# Patient Record
Sex: Male | Born: 1950 | Race: White | Hispanic: No | Marital: Married | Smoking: Former smoker
Health system: Southern US, Community
[De-identification: ages and names within clinical notes are randomized; demographics above are authoritative.]

## PROBLEM LIST (undated history)

## (undated) DIAGNOSIS — I255 Ischemic cardiomyopathy: Secondary | ICD-10-CM

## (undated) DIAGNOSIS — I2510 Atherosclerotic heart disease of native coronary artery without angina pectoris: Secondary | ICD-10-CM

## (undated) DIAGNOSIS — Z95810 Presence of automatic (implantable) cardiac defibrillator: Secondary | ICD-10-CM

## (undated) DIAGNOSIS — N529 Male erectile dysfunction, unspecified: Secondary | ICD-10-CM

## (undated) DIAGNOSIS — Z87891 Personal history of nicotine dependence: Secondary | ICD-10-CM

## (undated) DIAGNOSIS — I739 Peripheral vascular disease, unspecified: Secondary | ICD-10-CM

## (undated) DIAGNOSIS — E118 Type 2 diabetes mellitus with unspecified complications: Secondary | ICD-10-CM

## (undated) DIAGNOSIS — Z789 Other specified health status: Secondary | ICD-10-CM

## (undated) DIAGNOSIS — I10 Essential (primary) hypertension: Secondary | ICD-10-CM

## (undated) DIAGNOSIS — E785 Hyperlipidemia, unspecified: Secondary | ICD-10-CM

## (undated) HISTORY — DX: Ischemic cardiomyopathy: I25.5

## (undated) HISTORY — DX: Personal history of nicotine dependence: Z87.891

## (undated) HISTORY — DX: Hyperlipidemia, unspecified: E78.5

## (undated) HISTORY — DX: Male erectile dysfunction, unspecified: N52.9

## (undated) HISTORY — DX: Essential (primary) hypertension: I10

## (undated) HISTORY — DX: Peripheral vascular disease, unspecified: I73.9

## (undated) HISTORY — DX: Presence of automatic (implantable) cardiac defibrillator: Z95.810

## (undated) HISTORY — PX: INCISION AND DRAINAGE PERIRECTAL ABSCESS: SHX1804

## (undated) HISTORY — DX: Type 2 diabetes mellitus with unspecified complications: E11.8

## (undated) HISTORY — DX: Other specified health status: Z78.9

## (undated) HISTORY — DX: Atherosclerotic heart disease of native coronary artery without angina pectoris: I25.10

## (undated) HISTORY — PX: ILIAC ARTERY STENT: SHX1786

---

## 1998-02-17 DIAGNOSIS — I2510 Atherosclerotic heart disease of native coronary artery without angina pectoris: Secondary | ICD-10-CM

## 1998-02-17 HISTORY — DX: Atherosclerotic heart disease of native coronary artery without angina pectoris: I25.10

## 1998-02-17 HISTORY — PX: PERCUTANEOUS CORONARY STENT INTERVENTION (PCI-S): SHX6016

## 2006-02-17 HISTORY — PX: CORONARY ARTERY BYPASS GRAFT: SHX141

## 2006-09-10 ENCOUNTER — Inpatient Hospital Stay: Payer: Self-pay | Admitting: Internal Medicine

## 2006-09-11 ENCOUNTER — Inpatient Hospital Stay (HOSPITAL_COMMUNITY): Admission: AD | Admit: 2006-09-11 | Discharge: 2006-09-19 | Payer: Self-pay | Admitting: Cardiovascular Disease

## 2006-09-12 ENCOUNTER — Encounter: Payer: Self-pay | Admitting: Cardiothoracic Surgery

## 2006-09-12 ENCOUNTER — Ambulatory Visit: Payer: Self-pay | Admitting: Cardiothoracic Surgery

## 2006-09-28 ENCOUNTER — Ambulatory Visit: Payer: Self-pay | Admitting: Cardiothoracic Surgery

## 2006-10-09 ENCOUNTER — Ambulatory Visit: Payer: Self-pay | Admitting: Cardiothoracic Surgery

## 2006-10-09 ENCOUNTER — Encounter: Admission: RE | Admit: 2006-10-09 | Discharge: 2006-10-09 | Payer: Self-pay | Admitting: Cardiothoracic Surgery

## 2006-10-21 ENCOUNTER — Encounter: Payer: Self-pay | Admitting: Cardiovascular Disease

## 2006-11-18 ENCOUNTER — Encounter: Payer: Self-pay | Admitting: Cardiovascular Disease

## 2006-12-11 ENCOUNTER — Ambulatory Visit: Payer: Self-pay | Admitting: Cardiothoracic Surgery

## 2007-01-01 ENCOUNTER — Encounter: Payer: Self-pay | Admitting: Cardiovascular Disease

## 2007-01-08 ENCOUNTER — Ambulatory Visit (HOSPITAL_COMMUNITY): Admission: RE | Admit: 2007-01-08 | Discharge: 2007-01-08 | Payer: Self-pay | Admitting: Cardiology

## 2007-01-19 ENCOUNTER — Inpatient Hospital Stay (HOSPITAL_COMMUNITY): Admission: RE | Admit: 2007-01-19 | Discharge: 2007-01-20 | Payer: Self-pay | Admitting: Cardiovascular Disease

## 2007-01-19 ENCOUNTER — Encounter: Payer: Self-pay | Admitting: Cardiovascular Disease

## 2007-02-18 HISTORY — PX: CARDIAC DEFIBRILLATOR PLACEMENT: SHX171

## 2007-03-04 ENCOUNTER — Encounter: Payer: Self-pay | Admitting: Cardiovascular Disease

## 2007-04-30 ENCOUNTER — Ambulatory Visit: Payer: Self-pay | Admitting: Cardiothoracic Surgery

## 2008-06-21 IMAGING — CR DG CHEST 1V PORT
1 series · 1 of 1 positions shown · non-contrast
Comparison: none

REASON FOR EXAM: cp
COMMENTS:

PROCEDURE:     DXR - DXR PORTABLE CHEST SINGLE VIEW  - September 10, 2006  [DATE]
RESULT:     PA view of the chest shows the lung fields to be clear. No
pleural effusion or pneumothorax is seen. The heart is upper limits to
normal in size or mildly enlarged. No acute bony abnormalities are seen.

[view not recorded]
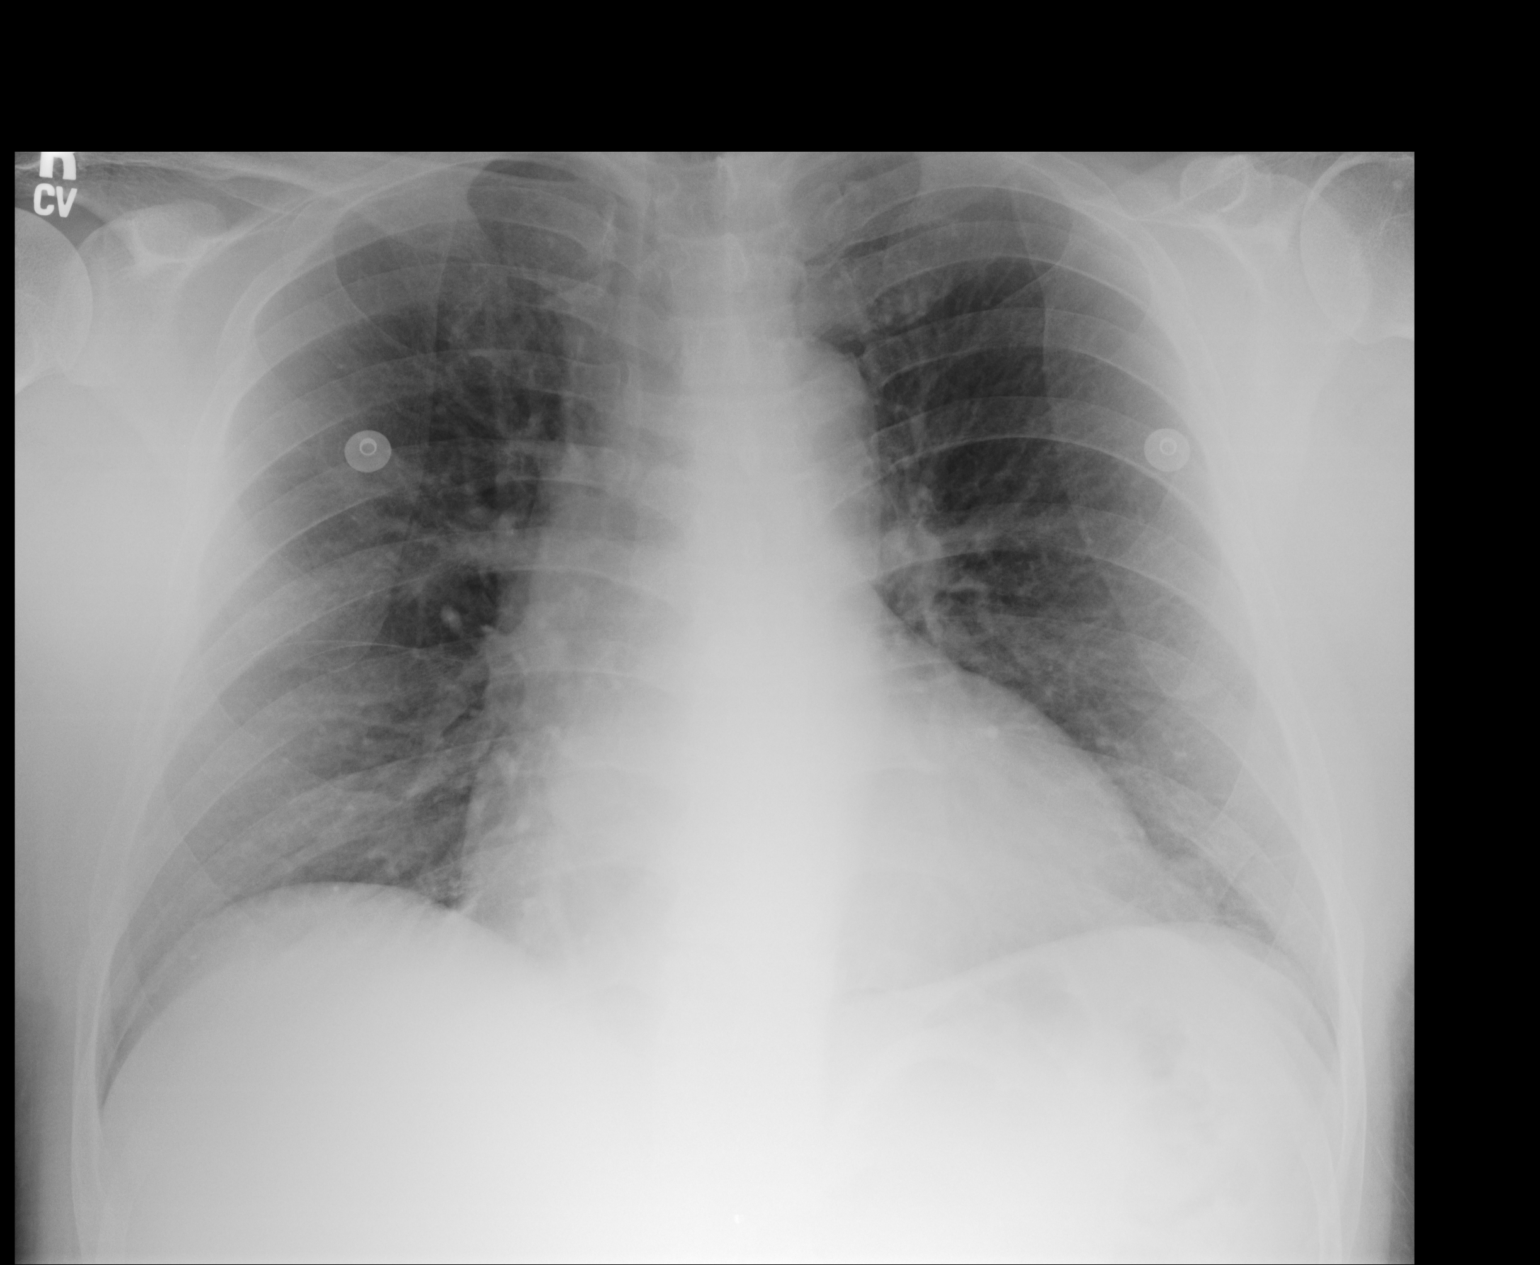

[1 of 1 positions shown; findings below may reference images not displayed]

IMPRESSION: 1. The lung fields are clear.
2. The chest is upper limits to normal in size or mildly enlarged.

## 2008-06-26 IMAGING — CR DG CHEST 1V PORT
1 series · 1 of 1 positions shown · non-contrast
Comparison: 09/14/06.

CLINICAL DATA: Postop CABG.  
 PORTABLE CHEST ? 1 VIEW ? 09/15/06 ? 7574 HOURS:

[view not recorded]
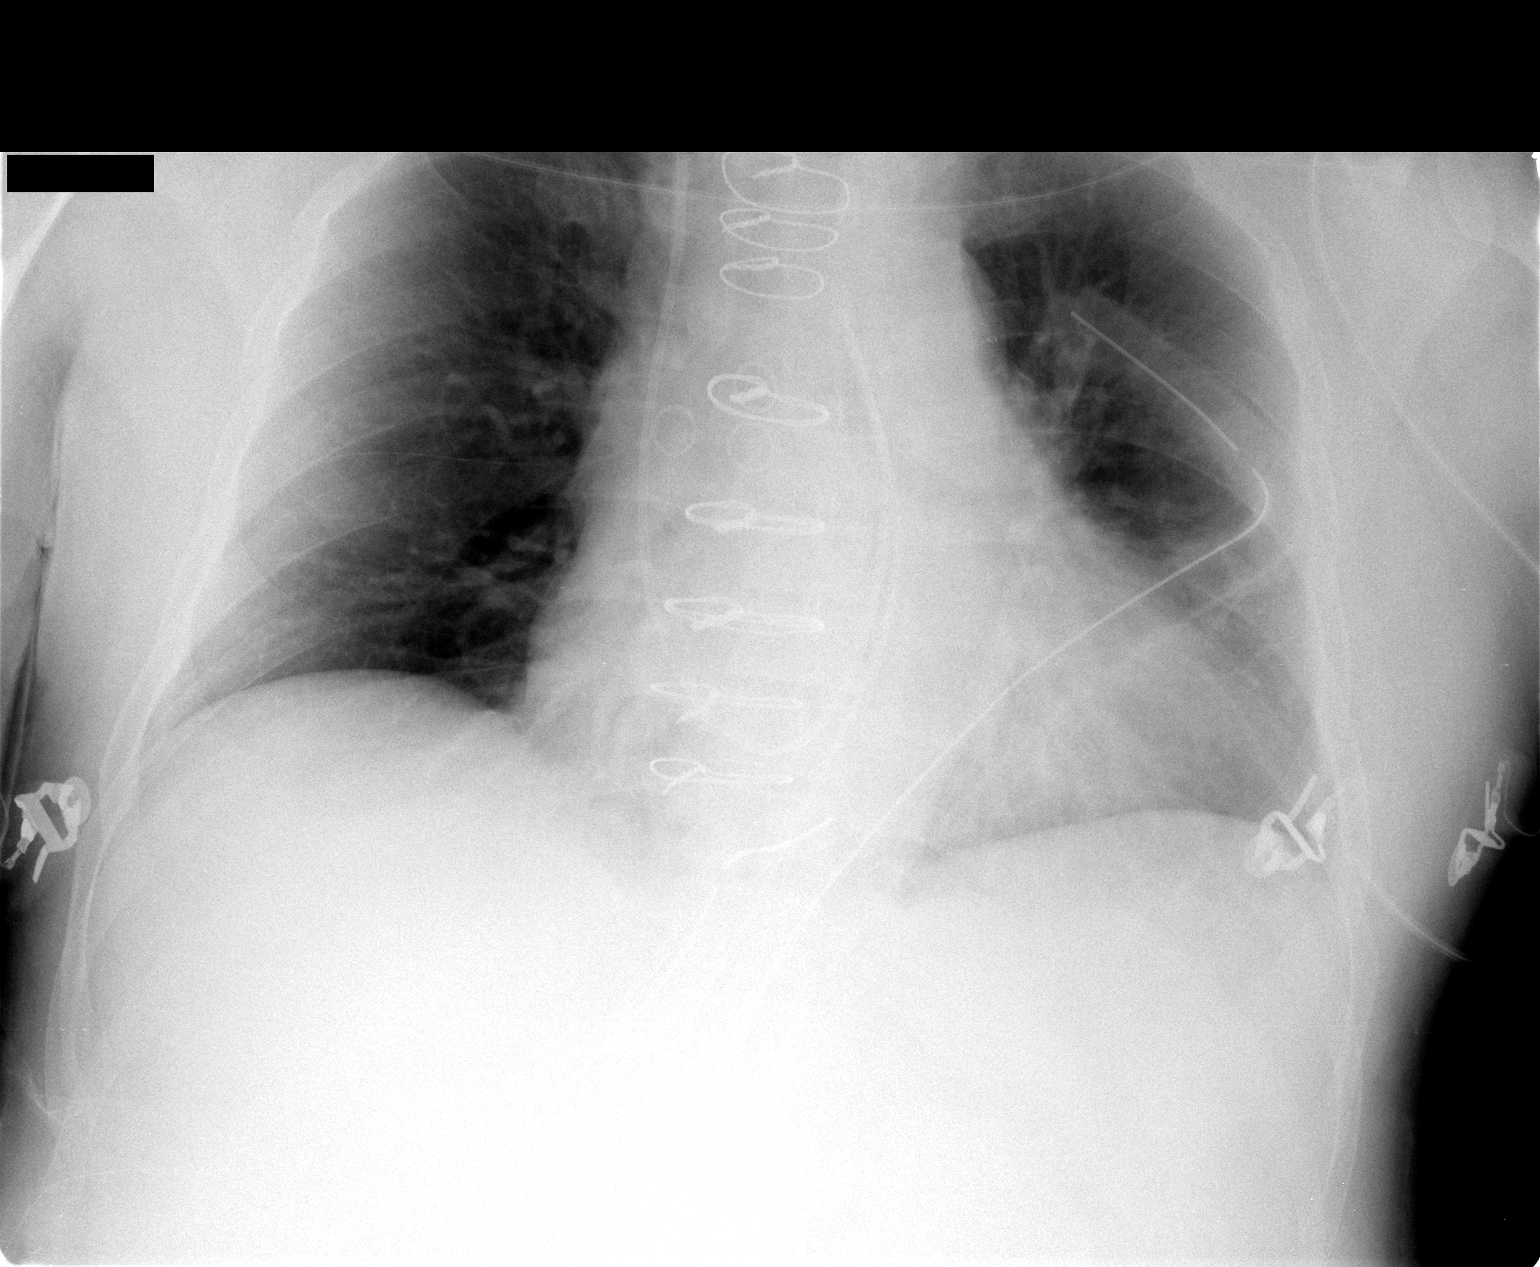

[1 of 1 positions shown; findings below may reference images not displayed]

FINDINGS: There has been interval extubation with removal of the nasogastric tube.  Swan-Ganz catheter and chest tubes remain in place.  There is no pneumothorax or edema.  Mild atelectasis is present at the left lung base.
IMPRESSION: No pneumothorax following extubation.  Mild left basilar atelectasis.

## 2008-07-20 IMAGING — CR DG CHEST 2V
2 series · 2 of 2 positions shown · non-contrast
Comparison: 09/17/06.

CLINICAL DATA: CABG.
 CHEST - TWO VIEWS:

[view not recorded (1 of 2)]
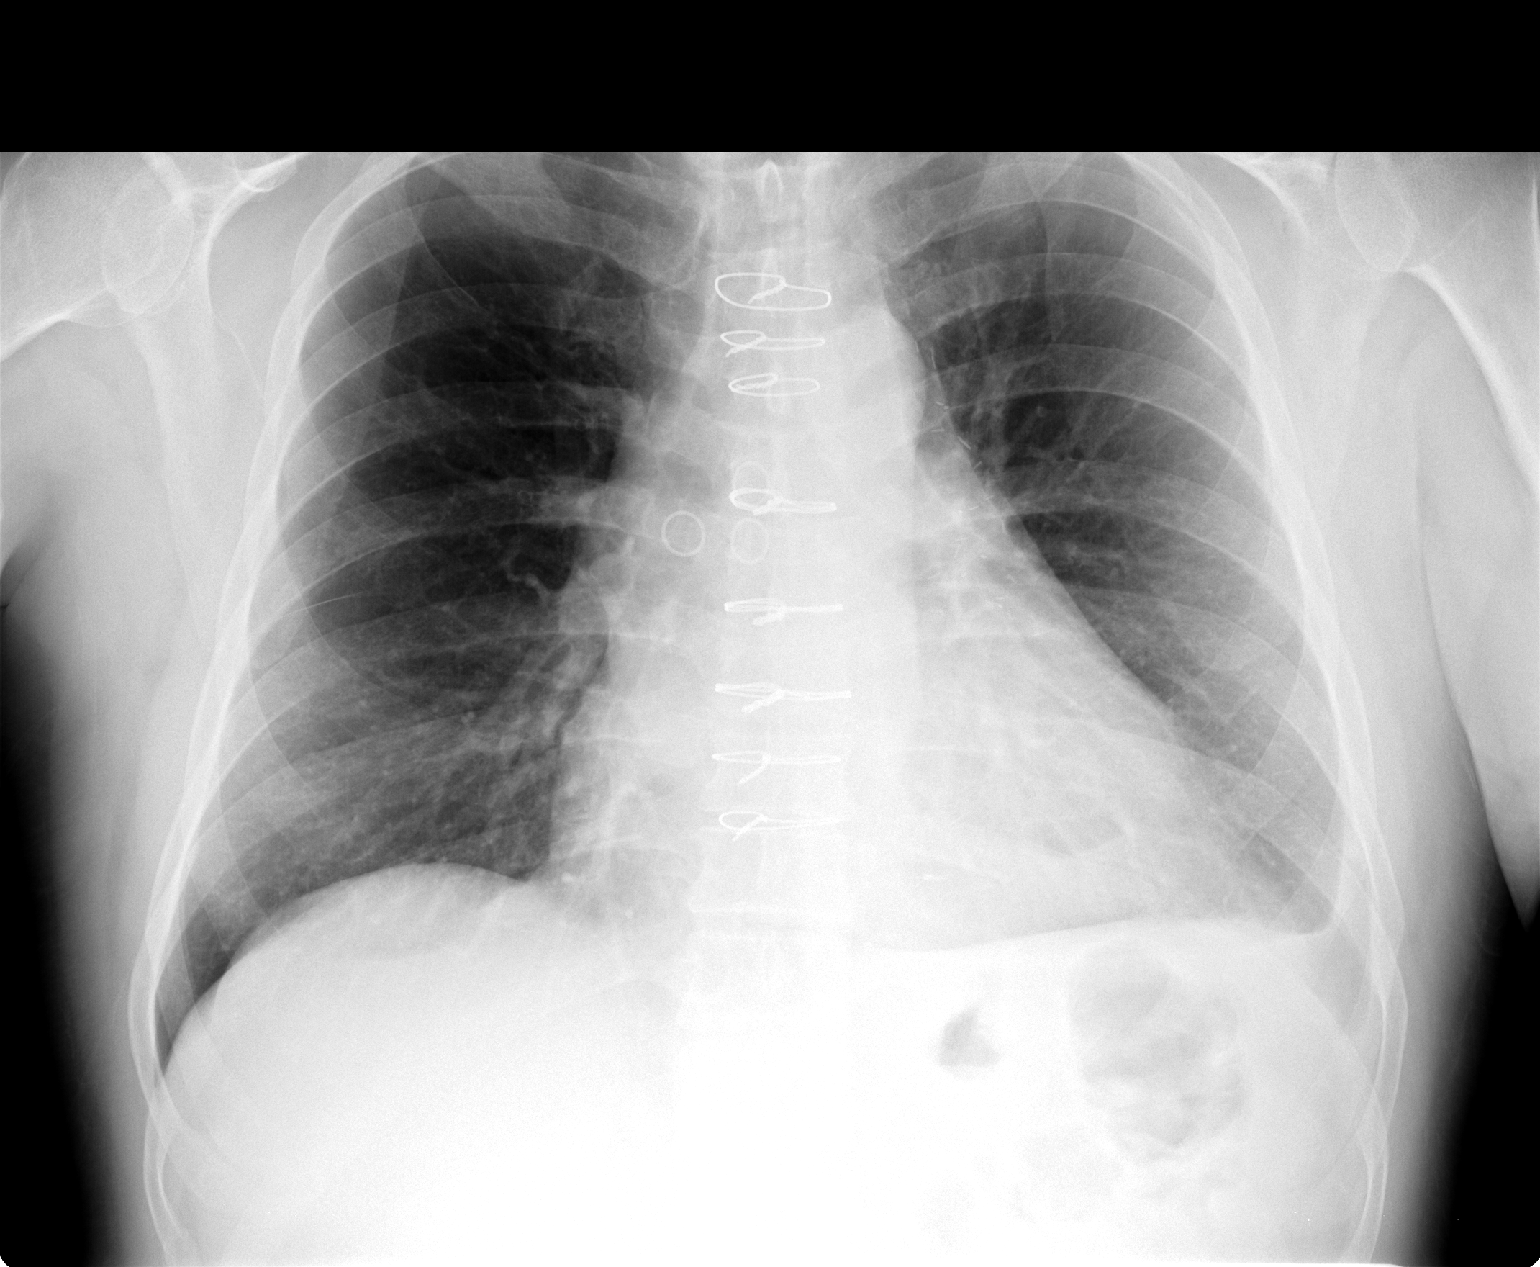

[view not recorded (2 of 2)]
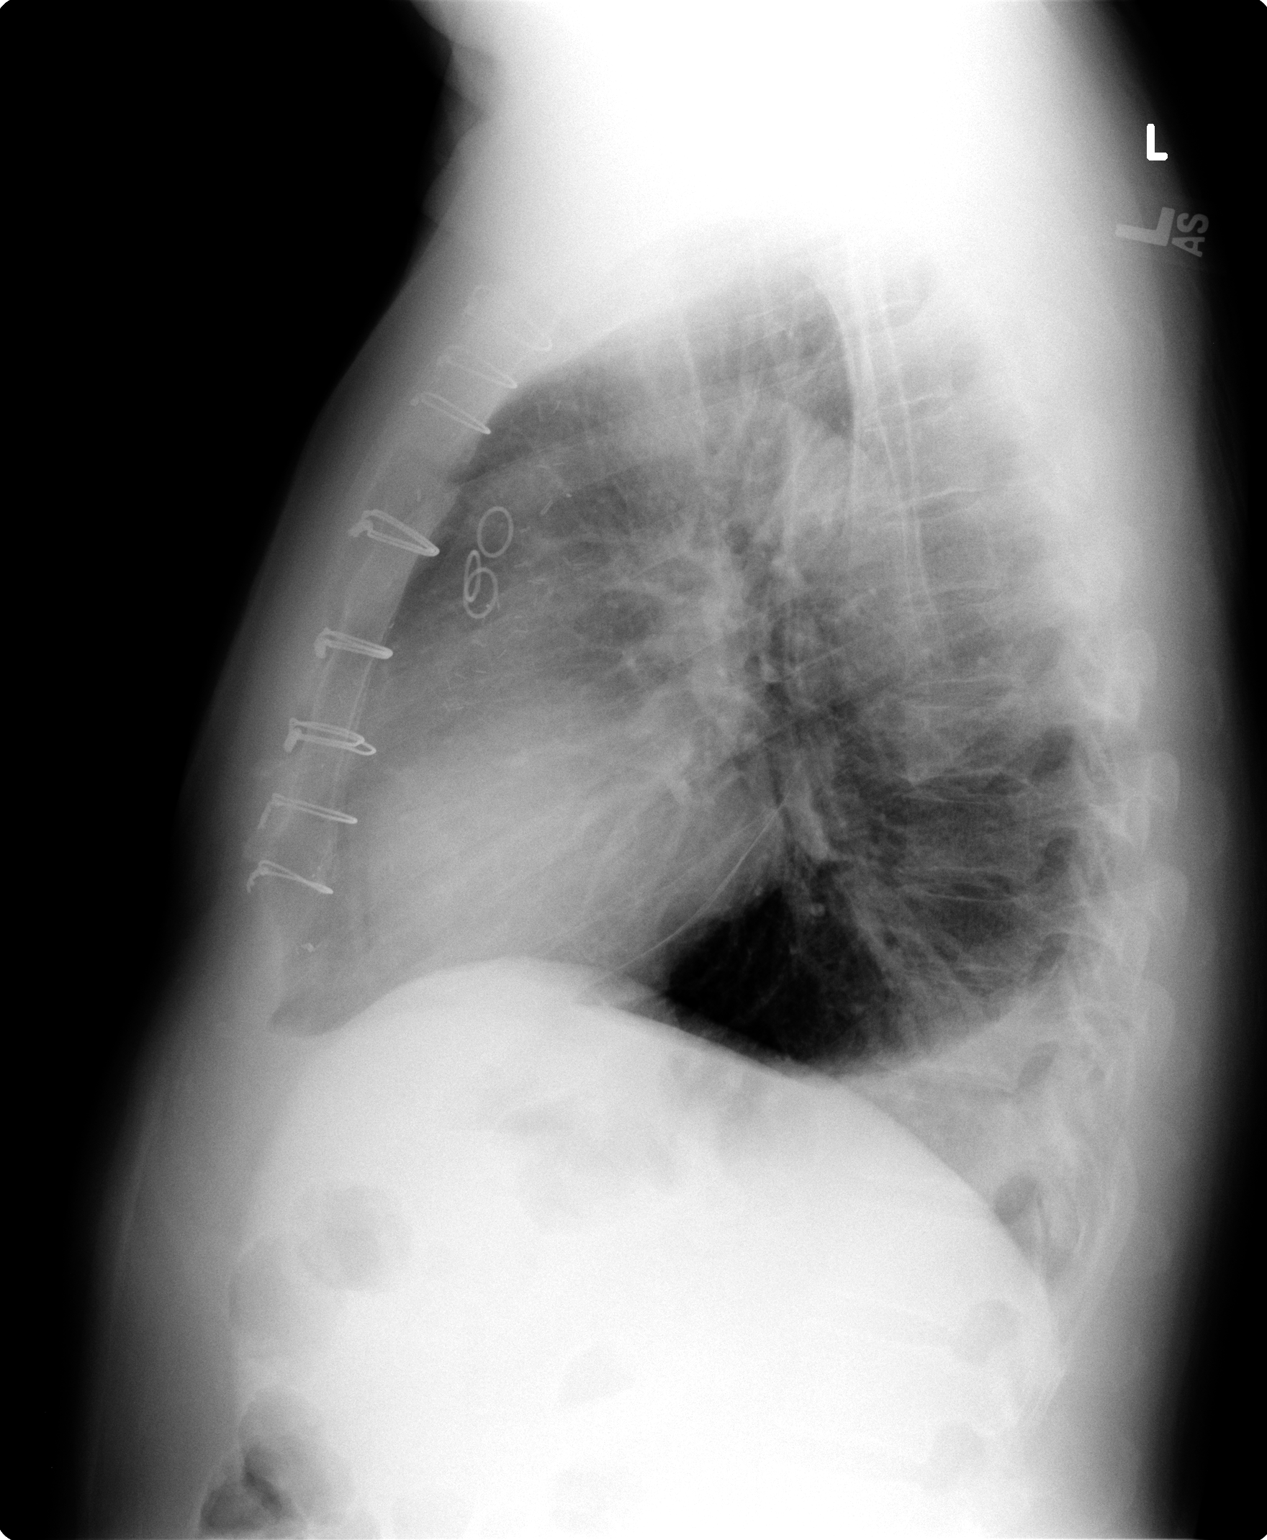

[2 of 2 positions shown; findings below may reference images not displayed]

Small left pleural effusion.  Lungs well expanded and clear of an active process.  Mild cardiomegaly.
IMPRESSION: Mild cardiomegaly.  Small left pleural effusion.

## 2009-01-15 ENCOUNTER — Encounter: Payer: Self-pay | Admitting: Cardiovascular Disease

## 2009-01-24 ENCOUNTER — Encounter: Payer: Self-pay | Admitting: Cardiovascular Disease

## 2009-05-16 DIAGNOSIS — Z95810 Presence of automatic (implantable) cardiac defibrillator: Secondary | ICD-10-CM

## 2009-05-16 DIAGNOSIS — I25810 Atherosclerosis of coronary artery bypass graft(s) without angina pectoris: Secondary | ICD-10-CM

## 2009-05-16 DIAGNOSIS — I739 Peripheral vascular disease, unspecified: Secondary | ICD-10-CM

## 2009-05-16 DIAGNOSIS — I10 Essential (primary) hypertension: Secondary | ICD-10-CM

## 2009-05-16 DIAGNOSIS — E785 Hyperlipidemia, unspecified: Secondary | ICD-10-CM

## 2009-05-16 DIAGNOSIS — I255 Ischemic cardiomyopathy: Secondary | ICD-10-CM

## 2009-05-16 HISTORY — DX: Presence of automatic (implantable) cardiac defibrillator: Z95.810

## 2009-06-14 ENCOUNTER — Encounter: Payer: Self-pay | Admitting: Cardiovascular Disease

## 2009-06-26 ENCOUNTER — Telehealth: Payer: Self-pay | Admitting: Cardiovascular Disease

## 2009-07-30 ENCOUNTER — Telehealth: Payer: Self-pay | Admitting: Cardiovascular Disease

## 2009-08-03 ENCOUNTER — Ambulatory Visit: Payer: Self-pay | Admitting: Cardiovascular Disease

## 2009-08-07 ENCOUNTER — Ambulatory Visit: Payer: Self-pay | Admitting: Cardiovascular Disease

## 2009-08-08 LAB — CONVERTED CEMR LAB
Albumin: 4.6 g/dL (ref 3.5–5.2)
Alkaline Phosphatase: 74 units/L (ref 39–117)
Bilirubin, Direct: 0.1 mg/dL (ref 0.0–0.3)
HDL: 43 mg/dL (ref >39)
Indirect Bilirubin: 0.5 mg/dL (ref 0.0–0.9)
LDL Cholesterol: 63 mg/dL (ref 0–99)
Total Bilirubin: 0.6 mg/dL (ref 0.3–1.2)

## 2009-10-01 ENCOUNTER — Ambulatory Visit: Payer: Self-pay | Admitting: Internal Medicine

## 2009-10-01 DIAGNOSIS — I4949 Other premature depolarization: Secondary | ICD-10-CM

## 2009-10-01 DIAGNOSIS — G4730 Sleep apnea, unspecified: Secondary | ICD-10-CM

## 2009-10-01 DIAGNOSIS — I5022 Chronic systolic (congestive) heart failure: Secondary | ICD-10-CM

## 2009-10-04 ENCOUNTER — Encounter: Payer: Self-pay | Admitting: Internal Medicine

## 2009-10-04 ENCOUNTER — Ambulatory Visit: Payer: Self-pay

## 2009-12-24 ENCOUNTER — Telehealth: Payer: Self-pay | Admitting: Cardiovascular Disease

## 2009-12-27 ENCOUNTER — Ambulatory Visit: Payer: Self-pay

## 2009-12-27 ENCOUNTER — Encounter: Payer: Self-pay | Admitting: Cardiovascular Disease

## 2010-01-03 ENCOUNTER — Ambulatory Visit: Payer: Self-pay | Admitting: Internal Medicine

## 2010-01-18 ENCOUNTER — Encounter (INDEPENDENT_AMBULATORY_CARE_PROVIDER_SITE_OTHER): Payer: Self-pay | Admitting: *Deleted

## 2010-01-25 ENCOUNTER — Encounter: Payer: Self-pay | Admitting: Cardiovascular Disease

## 2010-01-25 ENCOUNTER — Ambulatory Visit: Payer: Self-pay | Admitting: Cardiovascular Disease

## 2010-01-26 LAB — CONVERTED CEMR LAB
ALT: 18 units/L (ref 0–53)
AST: 21 units/L (ref 0–37)
Albumin: 4.7 g/dL (ref 3.5–5.2)
HDL: 47 mg/dL (ref >39)
Total Bilirubin: 0.6 mg/dL (ref 0.3–1.2)
Total CHOL/HDL Ratio: 3
VLDL: 19 mg/dL (ref 0–40)

## 2010-01-28 ENCOUNTER — Telehealth (INDEPENDENT_AMBULATORY_CARE_PROVIDER_SITE_OTHER): Payer: Self-pay | Admitting: *Deleted

## 2010-03-13 ENCOUNTER — Telehealth: Payer: Self-pay | Admitting: Cardiovascular Disease

## 2010-03-20 NOTE — Progress Notes (Signed)
Summary: ULTRASOUND  Phone Note Call from Patient Call back at Home Phone [phone]   Caller: SELF Call For: [facility] Summary of Call: PT WOULD LIKE A CALL BACK ABOUT NEEDING AN ULTRASOUND ON LEGS-RECEIVED A NOTICE FOR THIS FROM [facility] Initial call taken by: Harlon Flor,  December 24, 2009 8:09 AM  Follow-up for Phone Call        Pt has history of PVD and LE stents. Will schedule pt for LEA Follow-up by: Benedict Needy, RN,  December 24, 2009 8:48 AM

## 2010-03-20 NOTE — Assessment & Plan Note (Signed)
Summary: 6 month follow-up/ewj   Visit Type:  Follow-up Referring Navil Kole:  Tim Gollan,M.D. Primary Yosiah Jasmin:  None  CC:  6 month f/u.Marland Kitchen  History of Present Illness: Mr Paul Moore is a 60 year old gentleman with a history of coronary artery disease, bypass x5 in July 2008, catheterization in December 2008 for recurrent chest pain with stents to his native RCA, occluded vein graft to the PDA, 60-70% proximal vein graft disease to the diagonal,  s/p ICD implantation for primary prevention by [facility], presents for routine followup.  Since his last clinic visit, he has had significant weight loss of 10-15 pounds. He exercises daily, uses a game console with bowling and tennis. Mild shortness of breath though this is stable. Very rare episodes of chest discomfort though these are fleeting, stabbing and sometimes at rest. he would like to hold the Niaspan as this is very expensive. In the past, he has had diarrhea with metoprolol and Coreg.   echocardiogram in 2009 shows ejection fraction 20-30%, mildly dilated left ventricle, moderately enlarged right ventricle with moderately decreased systolic function.  Last stress test in November 2008, treadmill where he exercised only for 3 minutes, 5 minutes. The large region of decreased perfusion in the inferior and lateral walls no ischemia, ejection fraction 22%.  Last catheterization December 2008 with a PTCA and stenting of the RCA, Promus stent 3.0 x 28 mm. occluded vein graft to the RCA at the ostium, severe diffuse native RCA disease with 90% proximal, 90% mid, high-grade PDA narrowing in a small vessel, moderate stenosis at the ostium of the vein graft to the diagonal, patent LIMA and patent vein graft to the circumflex    Current Medications (verified): 1)  Aspirin Ec 325 Mg Tbec (Aspirin) .... Take One Tablet By Mouth Daily 2)  Plavix 75 Mg Tabs (Clopidogrel Bisulfate) .... Take One Tablet By Mouth Daily 3)  Isosorbide Mononitrate  Cr 30 Mg Xr24h-Tab (Isosorbide Mononitrate) .... Take One Tablet By Mouth Daily 4)  Klor-Con M20 20 Meq Cr-Tabs (Potassium Chloride Crys Cr) .Marland Kitchen.. 1 Tab Daily 5)  Furosemide 40 Mg Tabs (Furosemide) .... Take One Tablet By Mouth Daily. 6)  Crestor 10 Mg Tabs (Rosuvastatin Calcium) .... Take One Tablet By Mouth Daily. 7)  Niaspan 1000 Mg Cr-Tabs (Niacin (Antihyperlipidemic)) .Marland Kitchen.. 1 Tab Daily 8)  Lisinopril-Hydrochlorothiazide 20-12.5 Mg Tabs (Lisinopril-Hydrochlorothiazide) .Marland Kitchen.. 1 Once Daily  Allergies (verified): 1)  ! * Amiodarone  Past History:  Past Medical History: Last updated: 10/01/2009 Hypertension.  History of smoking.  Peripheral vascular disease, status post right iliac stent Ischemic cardiomyopathy with an ejection fraction of 20% -- ICD 04/2007  dyslipidemia.  CAD--CABG and stents.  Past Surgical History: Last updated: 05/16/2009 I&D of perirectal abscess Coronary artery bypass grafting x5 -- 09/14/06 right iliac stent ICD-- 04/2007  Family History: Last updated: 10/01/2009 Family History of Coronary Artery Disease:   Social History: Last updated: 10/01/2009 Disabled  Married  Tobacco Use - Former. Quit 2008. Alcohol Use - no Regular Exercise - yes--stat.bike 4 miles daily. Drug Use - no  Risk Factors: Exercise: yes (10/01/2009)  Risk Factors: Smoking Status: quit (10/01/2009)  Review of Systems       The patient complains of weight loss, chest pain, and dyspnea on exertion.  The patient denies fever, weight gain, vision loss, decreased hearing, hoarseness, syncope, peripheral edema, prolonged cough, abdominal pain, incontinence, muscle weakness, depression, and enlarged lymph nodes.    Vital Signs:  Patient profile:   60 year old male Height:  67 inches Weight:      210.75 pounds BMI:     33.13 BP sitting:   142 / 68  (left arm) Cuff size:   large  Vitals Entered By: Lysbeth Galas CMA (January 25, 2010 11:12 AM)  Physical Exam  General:   Well developed, well nourished, in no acute distress. Head:  normal HEENT Neck:  supple without thyromegaly; no venous distention Lungs:  clear to auscultation Heart:  Non-displaced PMI, chest non-tender; regular rate and rhythm, S1, S2 without murmurs, rubs or gallops. Carotid upstroke normal, no bruit. Pedals normal pulses. No edema, no varicosities. Abdomen:  Bowel sounds positive; abdomen soft and non-tender without masses, mild obesity Msk:  Back normal, normal gait. Muscle strength and tone normal. Pulses:  pulses normal in all 4 extremities Extremities:  No clubbing or cyanosis. Neurologic:  Alert and oriented x 3. Skin:  Intact without lesions or rashes. Psych:  Normal affect.    ICD Specifications Following MD:  Sherryl Manges, MD     ICD Vendor:  [employer]     ICD Model Number:  [id number]     ICD Serial Number:  [id number] ICD DOI:  05/13/2007     ICD Implanting MD:  NOT IMPLANTED BY [place]  Lead 1:    Location: RA     DOI: 05/13/2007     Model #: [id number]     Serial #: [id number]     Status: active Lead 2:    Location: RV     DOI: 05/13/2007     Model #: 6213     Serial #: [id number]     Status: active  Indications::  CM   ICD Follow Up ICD Dependent:  No      Episodes Coumadin:  No  Brady Parameters Mode DDDR+     Lower Rate Limit:  60     Upper Rate Limit 130 PAV 180     Sensed AV Delay:  150  Tachy Zones VF:  171     VT:  207     VT1:  182     Impression & Recommendations:  Problem # 1:  SYSTOLIC HEART FAILURE, CHRONIC (ICD-428.22) ejection fraction 35%. No signs of systolic heart failure. We will continue his current medication regimen. We have added bystolic 2.5 mg daily with titration to 5 mg daily after 1 to 2 weeks.  His updated medication list for this problem includes:    Aspirin Ec 325 Mg Tbec (Aspirin) .Marland Kitchen... Take one tablet by mouth daily    Plavix 75 Mg Tabs (Clopidogrel bisulfate) .Marland Kitchen... Take one tablet by mouth daily    Isosorbide Mononitrate Cr 30 Mg Xr24h-tab  (Isosorbide mononitrate) .Marland Kitchen... Take one tablet by mouth daily    Furosemide 40 Mg Tabs (Furosemide) .Marland Kitchen... Take one tablet by mouth daily.    Lisinopril-hydrochlorothiazide 20-12.5 Mg Tabs (Lisinopril-hydrochlorothiazide) .Marland Kitchen... 1 once daily    Bystolic 5 Mg Tabs (Nebivolol hcl) .Marland Kitchen... Take 1/2 tablet once daily  Problem # 2:  HYPERTENSION (ICD-401.9) Blood pressure is well controlled on his current medication regimen. Bystolic is new.   His updated medication list for this problem includes:    Aspirin Ec 325 Mg Tbec (Aspirin) .Marland Kitchen... Take one tablet by mouth daily    Furosemide 40 Mg Tabs (Furosemide) .Marland Kitchen... Take one tablet by mouth daily.    Lisinopril-hydrochlorothiazide 20-12.5 Mg Tabs (Lisinopril-hydrochlorothiazide) .Marland Kitchen... 1 once daily    Bystolic 5 Mg Tabs (Nebivolol hcl) .Marland Kitchen... Take 1/2 tablet once daily  Orders: T-Lipid  Profile [phone]) T-Hepatic Function [phone])  Problem # 3:  DYSLIPIDEMIA (ICD-272.4) LDL is slightly above goal. We will have him alternate Crestor 20 mg with 10 mg.  The following medications were removed from the medication list:    Niaspan 1000 Mg Cr-tabs (Niacin (antihyperlipidemic)) .Marland Kitchen... 1 tab daily His updated medication list for this problem includes:    Crestor 20 Mg Tabs (Rosuvastatin calcium) .Marland Kitchen... Take one tablet by mouth daily.  Orders: T-Lipid Profile [phone]) T-Hepatic Function [phone])  Problem # 4:  CORONARY ATHEROSCLEROSIS, AUTOLOGOUS VEIN BYPASS GRAFT (ICD-414.02) Severe coronary artery disease with occluded grafts x 2. Continue aggressive medical management. No testing at this time.  His updated medication list for this problem includes:    Aspirin Ec 325 Mg Tbec (Aspirin) .Marland Kitchen... Take one tablet by mouth daily    Plavix 75 Mg Tabs (Clopidogrel bisulfate) .Marland Kitchen... Take one tablet by mouth daily    Isosorbide Mononitrate Cr 30 Mg Xr24h-tab (Isosorbide mononitrate) .Marland Kitchen... Take one tablet by mouth daily     Lisinopril-hydrochlorothiazide 20-12.5 Mg Tabs (Lisinopril-hydrochlorothiazide) .Marland Kitchen... 1 once daily    Bystolic 5 Mg Tabs (Nebivolol hcl) .Marland Kitchen... Take 1/2 tablet once daily  Orders: T-Lipid Profile [phone]) T-Hepatic Function [phone])  Patient Instructions: 1)  Your physician recommends that you schedule a follow-up appointment in: 6 months 2)  Your physician has recommended you make the following change in your medication: Stop Niaspan. Start Bystolic 5mg  1/2 tab daily. Call our office when you need prescription. Increase Crestor 20mg  once daily.  Prescriptions: BYSTOLIC 5 MG TABS (NEBIVOLOL HCL) Take 1/2 tablet once daily  #28 x 0   Entered by:   Lanny Hurst RN   Authorized by:   Dossie Arbour MD   Signed by:   Lanny Hurst RN on 01/25/2010   Method used:   Samples Given   RxID:   [id number] FUROSEMIDE 40 MG TABS (FUROSEMIDE) Take one tablet by mouth daily.  #30 x 6   Entered by:   Lanny Hurst RN   Authorized by:   Dossie Arbour MD   Signed by:   Lanny Hurst RN on 01/25/2010   Method used:   Electronically to        [facility] [address] (retail)       [address]       [place], [place]  [address]       Ph: [phone]       Fax: [phone]   RxID:   [id number] LISINOPRIL-HYDROCHLOROTHIAZIDE 20-12.5 MG TABS (LISINOPRIL-HYDROCHLOROTHIAZIDE) 1 once daily  #30 x 6   Entered by:   Lanny Hurst RN   Authorized by:   Dossie Arbour MD   Signed by:   Lanny Hurst RN on 01/25/2010   Method used:   Electronically to        [facility] [address] (retail)       [address]       [place], [place]  [address]       Ph: [phone]       Fax: [phone]   RxID:   [id number] CRESTOR 20 MG TABS (ROSUVASTATIN CALCIUM) Take one tablet by mouth daily.  #30 x 6   Entered by:   Lanny Hurst RN   Authorized by:   Dossie Arbour MD   Signed by:   Lanny Hurst RN on 01/25/2010   Method used:   Electronically to  [facility] [address] (retail)       [address]       [place], [place]  [address]       Ph: [phone]       Fax: [phone]   RxID:   [id number] ISOSORBIDE MONONITRATE CR 30 MG XR24H-TAB (ISOSORBIDE MONONITRATE) Take one tablet by mouth daily  #30 x 6   Entered by:   Lanny Hurst RN   Authorized by:   Dossie Arbour MD   Signed by:   Lanny Hurst RN on 01/25/2010   Method used:   Electronically to        [facility] [address] (retail)       [address]       [place], [place]  [address]       Ph: [phone]       Fax: [phone]   RxID:   [id number] PLAVIX 75 MG TABS (CLOPIDOGREL BISULFATE) Take one tablet by mouth daily  #30 x 6   Entered by:   Lanny Hurst RN   Authorized by:   Dossie Arbour MD   Signed by:   Lanny Hurst RN on 01/25/2010   Method used:   Electronically to        [facility] [address] (retail)       [address]       [place], [place]  [address]       Ph: [phone]       Fax: [phone]   RxID:   [id number]

## 2010-03-20 NOTE — Cardiovascular Report (Signed)
Summary: Office Visit Remote   Office Visit Remote   Imported By: Roderic Ovens 01/18/2010 15:06:05  _____________________________________________________________________  External Attachment:    Type:   Image     Comment:   External Document

## 2010-03-20 NOTE — Assessment & Plan Note (Signed)
Summary: NP6/SGC   Visit Type:  Initial Consult Primary Provider:  None  CC:  Occasional stabbing pain in back .  History of Present Illness: 60 year old gentleman known to me from [facility] with a history of coronary artery disease, bypass x5 in July 2008, catheterization in December 2008 for recurrent chest pain with stents to his native RCA, occluded vein graft to the PDA, 60-70% proximal vein graft disease to the diagonal with ICD placed in March 1 9, ejection fraction at that time of 30% who presents to establish care.  Mr. Debow states that he has started to use his exercise bike. His weight was increasing and not to greater than 225 pounds. He has lost several pounds over the past several months. He does have some continued shortness of breath with exertion, fatigue, occasional stabbing chest pain. He reports one episode of pain when he had to take a nitroglycerin. He reports the losartan changed his personality and made him angry so he took himself off this medication.  echocardiogram in 2009 shows ejection fraction 20-30%, mildly dilated left ventricle, moderately enlarged right ventricle with moderately decreased systolic function.  Last stress test in November 2008, treadmill where he exercised only for 3 minutes, 5 minutes. The large region of decreased perfusion in the inferior and lateral walls no ischemia, ejection fraction 22%.  Last catheterization December 2008 with a PTCA and stenting of the RCA, Promus stent 3.0 x 28 mm. occluded vein graft to the RCA at the ostium, severe diffuse native RCA disease with 90% proximal, 90% mid, high-grade PDA narrowing in a small vessel, moderate stenosis at the ostium of the vein graft to the diagonal, patent LIMA and patent vein graft to the circumflex   Current Medications (verified): 1)  Aspirin Ec 325 Mg Tbec (Aspirin) .... Take One Tablet By Mouth Daily 2)  Plavix 75 Mg Tabs (Clopidogrel Bisulfate) .... Take  One Tablet By Mouth Daily 3)  Cozaar 100 Mg Tabs (Losartan Potassium) .Marland Kitchen.. 1 Tab Daily 4)  Isosorbide Mononitrate Cr 30 Mg Xr24h-Tab (Isosorbide Mononitrate) .... Take One Tablet By Mouth Daily 5)  Klor-Con M20 20 Meq Cr-Tabs (Potassium Chloride Crys Cr) .Marland Kitchen.. 1 Tab Daily 6)  Furosemide 40 Mg Tabs (Furosemide) .... Take One Tablet By Mouth Daily. 7)  Crestor 10 Mg Tabs (Rosuvastatin Calcium) .... Take One Tablet By Mouth Daily. 8)  Niaspan 1000 Mg Cr-Tabs (Niacin (Antihyperlipidemic)) .Marland Kitchen.. 1 Tab Daily 9)  Lisinopril-Hydrochlorothiazide 20-12.5 Mg Tabs (Lisinopril-Hydrochlorothiazide) .... Take 1 Tablet By Mouth Once A Day  Allergies (verified): 1)  ! * Amiodarone  Past History:  Past Medical History: Last updated: 05/16/2009 Hypertension.  History of smoking.  Peripheral vascular disease, status post right iliac stent Ischemic cardiomyopathy with an ejection fraction of 20% -- ICD 04/2007  dyslipidemia.   Past Surgical History: Last updated: 05/16/2009 I&D of perirectal abscess Coronary artery bypass grafting x5 -- 09/14/06 right iliac stent ICD-- 04/2007  Review of Systems       The patient complains of weight loss, chest pain, and dyspnea on exertion.  The patient denies fever, weight gain, vision loss, decreased hearing, hoarseness, prolonged cough, abdominal pain, incontinence, muscle weakness, depression, and enlarged lymph nodes.    Vital Signs:  Patient profile:   60 year old male Height:      67 inches Weight:      221 pounds BMI:     34.74 BP sitting:   126 / 84  (left arm) Cuff size:  regular  Vitals Entered By: Benedict Needy, RN (August 03, 2009 3:48 PM)  Physical Exam  General:  Well developed, well nourished, in no acute distress. Head:  normocephalic and atraumatic Neck:  Neck supple, no JVD. No masses, thyromegaly or abnormal cervical nodes. Lungs:  Clear bilaterally to auscultation and percussion. Heart:  Non-displaced PMI, chest non-tender; regular rate  and rhythm, S1, S2 without murmurs, rubs or gallops. Carotid upstroke normal, no bruit. Normal abdominal aortic size, no bruits.  Pedals normal pulses. No edema, no varicosities. Abdomen:  Bowel sounds positive; abdomen soft and non-tender without masses Msk:  Back normal, normal gait. Muscle strength and tone normal. Pulses:  pulses normal in all 4 extremities Extremities:  No clubbing or cyanosis. Neurologic:  Alert and oriented x 3. Skin:  Intact without lesions or rashes. Psych:  Normal affect.    EKG  Procedure date:  08/03/2009  Findings:      atrial paced rhythm at 72 beats per minute, old inferior and lateral infarct.  Impression & Recommendations:  Problem # 1:  CORONARY ATHEROSCLEROSIS, AUTOLOGOUS VEIN BYPASS GRAFT (ICD-414.02) no significant change in symptoms to suggest angina. He is been managed aggressively and his start his exercise program after gaining significant weight. He is no longer a smoker, is on aspirin Plavix.  The following medications were removed from the medication list:    Lisinopril-hydrochlorothiazide 20-12.5 Mg Tabs (Lisinopril-hydrochlorothiazide) .Marland Kitchen... Take 1 tablet by mouth once a day His updated medication list for this problem includes:    Aspirin Ec 325 Mg Tbec (Aspirin) .Marland Kitchen... Take one tablet by mouth daily    Plavix 75 Mg Tabs (Clopidogrel bisulfate) .Marland Kitchen... Take one tablet by mouth daily    Isosorbide Mononitrate Cr 30 Mg Xr24h-tab (Isosorbide mononitrate) .Marland Kitchen... Take one tablet by mouth daily    Lisinopril-hydrochlorothiazide 20-12.5 Mg Tabs (Lisinopril-hydrochlorothiazide) .Marland Kitchen... 1 once daily  Problem # 2:  CARDIOMYOPATHY, ISCHEMIC (ICD-414.8) ICD placed for an ejection fraction of 30 or less. No known history of ventricular arrhythmia. No signs of heart failure at this time.  The following medications were removed from the medication list:    Lisinopril-hydrochlorothiazide 20-12.5 Mg Tabs (Lisinopril-hydrochlorothiazide) .Marland Kitchen... Take 1 tablet  by mouth once a day His updated medication list for this problem includes:    Aspirin Ec 325 Mg Tbec (Aspirin) .Marland Kitchen... Take one tablet by mouth daily    Plavix 75 Mg Tabs (Clopidogrel bisulfate) .Marland Kitchen... Take one tablet by mouth daily    Cozaar 100 Mg Tabs (Losartan potassium) .Marland Kitchen... 1 tab daily    Isosorbide Mononitrate Cr 30 Mg Xr24h-tab (Isosorbide mononitrate) .Marland Kitchen... Take one tablet by mouth daily    Furosemide 40 Mg Tabs (Furosemide) .Marland Kitchen... Take one tablet by mouth daily.    Lisinopril-hydrochlorothiazide 20-12.5 Mg Tabs (Lisinopril-hydrochlorothiazide) .Marland Kitchen... 1 once daily  Problem # 3:  AUTOMATIC IMPLANTABLE CARDIAC DEFIBRILLATOR SITU (ICD-V45.02) We will have him follow up with EP throughout office for check of his defibrillator.  Problem # 4:  DYSLIPIDEMIA (ICD-272.4) Cholesterol has been well controlled and in fact we have been decreasing his Crestor do to a very low LDL and in light of hid erectile dysfunction issues.  His updated medication list for this problem includes:    Crestor 10 Mg Tabs (Rosuvastatin calcium) .Marland Kitchen... Take one tablet by mouth daily.    Niaspan 1000 Mg Cr-tabs (Niacin (antihyperlipidemic)) .Marland Kitchen... 1 tab daily  Patient Instructions: 1)  Your physician recommends that you schedule a follow-up appointment in: 6 months with Dr. Mariah Milling needs appointment with Dr.  [name] for ICD check. 2)  Your physician recommends that you return for a FASTING lipid profile: at your convience (liv/lip). 3)  Your physician has recommended you make the following change in your medication: Start on Lisinopril HCT 20/12.5mg  take one tablet everyday. Prescriptions: LISINOPRIL-HYDROCHLOROTHIAZIDE 20-12.5 MG TABS (LISINOPRIL-HYDROCHLOROTHIAZIDE) 1 once daily  #90 x 4   Entered by:   Bishop Dublin, CMA   Authorized by:   Dossie Arbour MD   Signed by:   Bishop Dublin, CMA on 08/03/2009   Method used:   Electronically to        [facility] (retail)       [address]       [place], [place]  [address]       Ph: [phone]       Fax: [phone]   RxID:   [id number]

## 2010-03-20 NOTE — Letter (Signed)
Summary: PHI  PHI   Imported By: Harlon Flor 08/08/2009 08:04:58  _____________________________________________________________________  External Attachment:    Type:   Image     Comment:   External Document

## 2010-03-20 NOTE — Progress Notes (Signed)
Summary: RX  Phone Note Refill Request Call back at Home Phone [phone] Message from:  Patient on Jun 26, 2009 3:53 PM  Refills Requested: Medication #1:  NIASPAN 1000 MG CR-TABS 1 tab daily. [facility]  Initial call taken by: Harlon Flor,  Jun 26, 2009 3:53 PM    Prescriptions: NIASPAN 1000 MG CR-TABS (NIACIN (ANTIHYPERLIPIDEMIC)) 1 tab daily  #30 x 11   Entered by:   Mercer Pod   Authorized by:   Dossie Arbour MD   Signed by:   Mercer Pod on 06/27/2009   Method used:   Electronically to        [facility] [address] [address] (retail)       [address]       [place], [place]  [address]       Ph: [phone]       Fax: [phone]   RxID:   [id number]

## 2010-03-20 NOTE — Procedures (Signed)
Summary: NEW PT   Visit Type:  Initial Consult Referring Provider:  Tim Gollan,M.D. Primary Provider:  None  CC:  "Doing well"..  History of Present Illness: Mr Steib is a 60 year old gentleman with a history of coronary artery disease, bypass x5 in July 2008, catheterization in December 2008 for recurrent chest pain with stents to his native RCA, occluded vein graft to the PDA, 60-70% proximal vein graft disease to the diagonal;  He is s/p ICD implantation for primary prevention by [employer] at request of [facility] and is seen today to establish care of his device. He has had no device therapy of which he is aware  He has mmodest sob as well as PND/Orthopnea. This is some what improved since recent 13 lb eweight loss.  He has daytime somnolence and obstructive breathing patterns at night. He thinks that this is somewhat better  echocardiogram in 2009 shows ejection fraction 20-30%, mildly dilated left ventricle, moderately enlarged right ventricle with moderately decreased systolic function.  Last stress test in November 2008, treadmill where he exercised only for 3 minutes, 5 minutes. The large region of decreased perfusion in the inferior and lateral walls no ischemia, ejection fraction 22%.  Last catheterization December 2008 with a PTCA and stenting of the RCA, Promus stent 3.0 x 28 mm. occluded vein graft to the RCA at the ostium, severe diffuse native RCA disease with 90% proximal, 90% mid, high-grade PDA narrowing in a small vessel, moderate stenosis at the ostium of the vein graft to the diagonal, patent LIMA and patent vein graft to the circumflex   Preventive Screening-Counseling & Management  Alcohol-Tobacco     Smoking Status: quit  Caffeine-Diet-Exercise     Does Patient Exercise: yes      Drug Use:  no.    Current Medications (verified): 1)  Aspirin Ec 325 Mg Tbec (Aspirin) .... Take One Tablet By Mouth Daily 2)  Plavix 75 Mg Tabs (Clopidogrel Bisulfate)  .... Take One Tablet By Mouth Daily 3)  Isosorbide Mononitrate Cr 30 Mg Xr24h-Tab (Isosorbide Mononitrate) .... Take One Tablet By Mouth Daily 4)  Klor-Con M20 20 Meq Cr-Tabs (Potassium Chloride Crys Cr) .Marland Kitchen.. 1 Tab Daily 5)  Furosemide 40 Mg Tabs (Furosemide) .... Take One Tablet By Mouth Daily. 6)  Crestor 10 Mg Tabs (Rosuvastatin Calcium) .... Take One Tablet By Mouth Daily. 7)  Niaspan 1000 Mg Cr-Tabs (Niacin (Antihyperlipidemic)) .Marland Kitchen.. 1 Tab Daily 8)  Lisinopril-Hydrochlorothiazide 20-12.5 Mg Tabs (Lisinopril-Hydrochlorothiazide) .Marland Kitchen.. 1 Once Daily  Allergies (verified): 1)  ! * Amiodarone  Past History:  Past Surgical History: Last updated: 05/16/2009 I&D of perirectal abscess Coronary artery bypass grafting x5 -- 09/14/06 right iliac stent ICD-- 04/2007  Family History: Last updated: 10/01/2009 Family History of Coronary Artery Disease:   Social History: Last updated: 10/01/2009 Disabled  Married  Tobacco Use - Former. Quit 2008. Alcohol Use - no Regular Exercise - yes--stat.bike 4 miles daily. Drug Use - no  Risk Factors: Exercise: yes (10/01/2009)  Risk Factors: Smoking Status: quit (10/01/2009)  Past Medical History: Hypertension.  History of smoking.  Peripheral vascular disease, status post right iliac stent Ischemic cardiomyopathy with an ejection fraction of 20% -- ICD 04/2007  dyslipidemia.  CAD--CABG and stents.  Family History: Family History of Coronary Artery Disease:   Social History: Disabled  Married  Tobacco Use - Former. Quit 2008. Alcohol Use - no Regular Exercise - yes--stat.bike 4 miles daily. Drug Use - no Smoking Status:  quit Does Patient Exercise:  yes Drug  Use:  no  Review of Systems       full review of systems was negative apart from a history of present illness and past medical history.   Vital Signs:  Patient profile:   60 year old male Height:      67 inches Weight:      216 pounds BMI:     33.95 Pulse rate:   72 /  minute BP sitting:   110 / 68  (left arm) Cuff size:   large  Vitals Entered By: Bishop Dublin, CMA (October 01, 2009 3:59 PM)  Physical Exam  General:  Well developed, well nourished, in no acute distress. Head:  normal HEENT Neck:  supple without thyromegaly; no venous distention Chest Wall:  no kyphosis or scoliosis Lungs:  clear to auscultation Heart:  irregular rate and rhythm without murmurs or gallops Abdomen:  protuberant but soft without hepatomegaly; bowel sounds are active Msk:  Back normal, normal gait. Muscle strength and tone normal. Pulses:  intact distal pulses Extremities:  no clubbing cyanosis or edema Neurologic:  alert and oriented grossly normal motor and sensory found Skin:  warm and dry Cervical Nodes:  no adenopathy Psych:  Engaging  affect   EKG  Procedure date:  10/01/2009  Findings:      atrial paced rhythm at 74 interval 15/0.10/0.37 Axis -30 PVCs in a pattern of trigeminy with a right bundle-branch superior axis morphology identified a left ventricular floor focus    ICD Specifications Following MD:  Sherryl Manges, MD     ICD Vendor:  [employer]     ICD Model Number:  [id number]     ICD Serial Number:  [id number] ICD DOI:  05/13/2007     ICD Implanting MD:  NOT IMPLANTED BY [place]  Lead 1:    Location: RA     DOI: 05/13/2007     Model #: [id number]     Serial #: [id number]     Status: active Lead 2:    Location: RV     DOI: 05/13/2007     Model #: [id number]     Serial #: [id number]     Status: active  Indications::  CM   ICD Follow Up Remote Check?  No Battery Voltage:  3.16 V     Charge Time:  9.4 seconds     Underlying rhythm:  SR ICD Dependent:  No       ICD Device Measurements Atrium:  Amplitude: 4.0 mV, Impedance: 448 ohms, Threshold: 0.5 V at 0.4 msec Right Ventricle:  Amplitude: 17.3 mV, Impedance: 560 ohms, Threshold: 0.5 V at 0.4 msec Shock Impedance: 48/62 ohms   Episodes MS Episodes:  0     Percent Mode Switch:  0     Coumadin:  No Shock:  0      ATP:  0     Nonsustained:  1     Atrial Pacing:  46.5%     Ventricular Pacing:  10%  Brady Parameters Mode DDDR+     Lower Rate Limit:  60     Upper Rate Limit 130 PAV 180     Sensed AV Delay:  150  Tachy Zones VF:  171     VT:  207     VT1:  182     Next Remote Date:  01/03/2010     Next Cardiology Appt Due:  09/18/2010 Tech Comments:  No parameter changes.  Device function normal. 490single PVC's /hour.  We will enroll him in  our [facility] as soon as [facility] releases, for transmissions every 3 months.  ROV 1 year with Dr. [name] in [place]. Altha Harm, LPN  October 01, 2009 4:22 PM   Impression & Recommendations:  Problem # 1:  AUTOMATIC IMPLANTABLE CARDIAC DEFIBRILLATOR SITU (ICD-V45.02) Device parameters and data were reviewed and no changes were made;  patient's device implant for primary prevention has never gone off. We will arrange care like transmissions and we'll see him in 12 months  Problem # 2:  SYSTOLIC HEART FAILURE, CHRONIC (ICD-428.22) the patient has stable class IIB-3A congestive heart failure. He also has symptoms of orthopnea and nocturnal dyspnea which are somewhat improved with recent weight loss.  Much of this is penetrated his ischemic heart disease. It is also noteworthy however that he has frequent PVCs comprising about 8-10% of his beats over the last 14 months. This raises the possibility that a tachycardia/PVC cardiomyopathy might be contributed both to his left ventricular dysfunction as well as his congestive symptoms.  He carries an diagnosis of amiodarone allergy; this is unlikely as it was so acute that is possible. In the event that his cardiomyopathy/symptoms prompt intervention for his PVCs, I think we would Not Use amiodarone, and potentially try mexiletine instead. If we were successful in a limited numb and saw improvement in his LV function, catheter ablation might be a reasonable adjunct.  Lee to follow his left ventricular function carefully. It's  been 2 years since his last echo; we will repeat the ultrasound at this time  Problem # 3:  PREMATURE VENTRICULAR CONTRACTIONS (ICD-427.69) please see the aforementioned discussion  Problem # 4:  CARDIOMYOPATHY, ISCHEMIC (ICD-414.8) as above  Orders: Echocardiogram (Echo)  Patient Instructions: 1)  Your physician has requested that you have an echocardiogram.  Echocardiography is a painless test that uses sound waves to create images of your heart. It provides your doctor with information about the size and shape of your heart and how well your heart's chambers and valves are working.  This procedure takes approximately one hour. There are no restrictions for this procedure.  Appended Document: NEW PT please advise me what to tell pt about his results.

## 2010-03-20 NOTE — Letter (Signed)
Summary: PHI  PHI   Imported By: Harlon Flor 10/02/2009 08:09:13  _____________________________________________________________________  External Attachment:    Type:   Image     Comment:   External Document

## 2010-03-20 NOTE — Progress Notes (Signed)
Summary: RX Lisinopril HCT  Phone Note Refill Request Call back at Home Phone [phone] Message from:  Patient on July 30, 2009 8:11 AM  Refills Requested: Medication #1:  LISINOPRIL HCTZ 20 MG AND 12.5 MG [facility]  Initial call taken by: Harlon Flor,  July 30, 2009 8:11 AM  Follow-up for Phone Call        Scheduled appt. 08/03/09. Follow-up by: Bishop Dublin, CMA,  July 30, 2009 9:23 AM    New/Updated Medications: LISINOPRIL-HYDROCHLOROTHIAZIDE 20-12.5 MG TABS (LISINOPRIL-HYDROCHLOROTHIAZIDE) Take 1 tablet by mouth once a day Prescriptions: LISINOPRIL-HYDROCHLOROTHIAZIDE 20-12.5 MG TABS (LISINOPRIL-HYDROCHLOROTHIAZIDE) Take 1 tablet by mouth once a day  #30 x 2   Entered by:   Bishop Dublin, CMA   Authorized by:   Dossie Arbour MD   Signed by:   Bishop Dublin, CMA on 07/30/2009   Method used:   Electronically to        [facility]  [address] (retail)       [address]       [place], [place]  [address]       Ph: [phone]       Fax: [phone]   RxID:   [id number]

## 2010-03-20 NOTE — Miscellaneous (Signed)
Summary: Device preload  Clinical Lists Changes  Observations: Added new observation of ICD INDICATN: CM (10/01/2009 8:33) Added new observation of ICDLEADSTAT2: active (10/01/2009 8:33) Added new observation of ICDLEADSER2: 161096  (10/01/2009 8:33) Added new observation of ICDLEADMOD2: 0185  (10/01/2009 8:33) Added new observation of ICDLEADDOI2: 05/13/2007  (10/01/2009 8:33) Added new observation of ICDLEADLOC2: RV  (10/01/2009 8:33) Added new observation of ICDLEADSTAT1: active  (10/01/2009 8:33) Added new observation of ICDLEADSER1: [id number]  (10/01/2009 8:33) Added new observation of ICDLEADMOD1: 5076  (10/01/2009 8:33) Added new observation of ICDLEADDOI1: 05/13/2007  (10/01/2009 8:33) Added new observation of ICDLEADLOC1: RA  (10/01/2009 8:33) Added new observation of ICD IMP MD: NOT IMPLANTED BY [place]  (10/01/2009 8:33) Added new observation of ICD IMPL DTE: 05/13/2007  (10/01/2009 8:33) Added new observation of ICD SERL#: [id number] H  (10/01/2009 8:33) Added new observation of ICD MODL#: D154AWG  (10/01/2009 8:33) Added new observation of ICDMANUFACTR: [employer]  (10/01/2009 8:33) Added new observation of ICD MD: Sherryl Manges, MD  (10/01/2009 8:33)       ICD Specifications Following MD:  Sherryl Manges, MD     ICD Vendor:  [employer]     ICD Model Number:  D154AWG     ICD Serial Number:  [id number] ICD DOI:  05/13/2007     ICD Implanting MD:  NOT IMPLANTED BY [place]  Lead 1:    Location: RA     DOI: 05/13/2007     Model #: [id number]     Serial #: [id number]     Status: active Lead 2:    Location: RV     DOI: 05/13/2007     Model #: [id number]     Serial #: [id number]     Status: active  Indications::  CM

## 2010-03-20 NOTE — Cardiovascular Report (Signed)
Summary: Cardiac Cath Other  Cardiac Cath Other   Imported By: Harlon Flor 08/08/2009 08:03:14  _____________________________________________________________________  External Attachment:    Type:   Image     Comment:   External Document

## 2010-03-20 NOTE — Progress Notes (Signed)
Summary: RECORD RELEASE  RECORD RELEASE   Imported By: Frazier Butt Chriscoe 06/14/2009 11:42:18  _____________________________________________________________________  External Attachment:    Type:   Image     Comment:   External Document

## 2010-03-20 NOTE — Letter (Signed)
Summary: Remote Device Check  [facility], Main Office  [address]   [place], [place] [address]   Phone: [phone]  Fax: [phone]     January 18, 2010 MRN: [id number]   ANDRU GENTER [address] [place], [place]  [address]   Dear Mr. MERIDA,   Your remote transmission was recieved and reviewed by your physician.  All diagnostics were within normal limits for you.  _X____Your next transmission is scheduled for:04/04/2010.    Please transmit at any time this day.  If you have a wireless device your transmission will be sent automatically.  ______Your next office visit is scheduled for:                              . Please call our office to schedule an appointment.    Sincerely,  Altha Harm, LPN

## 2010-03-21 NOTE — Progress Notes (Signed)
Summary: RX  Phone Note Refill Request Call back at Home Phone [phone] Message from:  Patient on March 13, 2010 9:42 AM  Refills Requested: Medication #1:  KLOR-CON M20 20 MEQ CR-TABS 1 tab daily [facility] in [place]  Initial call taken by: Harlon Flor,  March 13, 2010 9:43 AM     Prescriptions: KLOR-CON M20 20 MEQ CR-TABS (POTASSIUM CHLORIDE CRYS CR) 1 tab daily  #30 x 6   Entered by:   Bishop Dublin, CMA   Authorized by:   Dossie Arbour MD   Signed by:   Bishop Dublin, CMA on 03/13/2010   Method used:   Electronically to        [facility]  [address] (retail)       [address]       [place], [place]  [address]       Ph: [phone]       Fax: [phone]   RxID:   [id number]

## 2010-03-21 NOTE — Progress Notes (Signed)
Summary: question re transmission  Phone Note Call from Patient Call back at [facility] Phone [phone]   Caller: Patient Reason for Call: Talk to Nurse Summary of Call: pt has question re transmission Initial call taken by: Roe Coombs,  January 28, 2010 4:16 PM  Follow-up for Phone Call        Patient needed clairfication on sending his next transmissions. Follow-up by: Altha Harm, LPN,  January 30, 2010 8:13 AM

## 2010-04-04 ENCOUNTER — Encounter: Payer: Self-pay | Admitting: Internal Medicine

## 2010-04-04 ENCOUNTER — Encounter (INDEPENDENT_AMBULATORY_CARE_PROVIDER_SITE_OTHER): Payer: Medicare Other

## 2010-04-04 DIAGNOSIS — I428 Other cardiomyopathies: Secondary | ICD-10-CM

## 2010-04-04 DIAGNOSIS — I509 Heart failure, unspecified: Secondary | ICD-10-CM

## 2010-04-18 ENCOUNTER — Encounter (INDEPENDENT_AMBULATORY_CARE_PROVIDER_SITE_OTHER): Payer: Self-pay | Admitting: *Deleted

## 2010-04-25 NOTE — Cardiovascular Report (Signed)
Summary: Office Visit   Office Visit   Imported By: Roderic Ovens 04/18/2010 16:00:32  _____________________________________________________________________  External Attachment:    Type:   Image     Comment:   External Document

## 2010-04-25 NOTE — Letter (Signed)
Summary: Remote Device Check  [facility], Main Office  [address]   [place], [place] [address]   Phone: [phone]  Fax: [phone]     April 18, 2010 MRN: [id number]   Paul Moore [address] [place], [place]  [address]   Dear Mr. ARNDT,   Your remote transmission was recieved and reviewed by your physician.  All diagnostics were within normal limits for you.  __X___Your next transmission is scheduled for: 07/04/2010.   Please transmit at any time this day.  If you have a wireless device your transmission will be sent automatically.  ______Your next office visit is scheduled for:                              . Please call our office to schedule an appointment.    Sincerely,  Altha Harm, LPN

## 2010-07-02 NOTE — Assessment & Plan Note (Signed)
OFFICE VISIT   Paul Moore, Paul Moore  DOB:  [birth year]                                        October 09, 2006  CHART #:  [id number]   OFFICE VISIT   CURRENT PROBLEMS:  1. Status post CABG times five 09/14/2006 for Class IV postinfarction      unstable angina.  2. Status post lateral MI 2001.  3. Hypertension.  4. Right iliac stent 2001 for hip claudication.   HISTORY OF PRESENT ILLNESS:  Paul Moore returns for his first post-  operative office visit, after undergoing urgent five vessel coronary  artery bypass grafting.  He is a 61 year old male with a prior history  of coronary disease and percutaneous intervention in 2001 when he had a  lateral MI.  He continued to smoke until recently and developed unstable  angina with positive cardiac enzymes.  Dr. Tresa Endo demonstrated by cath  high grade stenosis, LAD-diagonal, moderate stenosis with circumflex and  a high grade stenosis of the right coronary with disease at the  bifurcation.  His EF was 45%.  He underwent left IMA graft of the LAD  and _saphenou vein_________ graft of the diagonal, OM, and PD and PL.  He did well post-operatively remaining in a sinus rhythm.  Was  discharged home on the fourth post-operative day on aspirin 81 mg,  Lopressor 50 mg b.i.d., Lisinopril 10 mg daily, Lipitor 40 mg daily,  Ultram for pain, and a short course of Lasix and potassium.  Since  returning home, he has had no recurrent chest pains or symptoms of CHF  and the surgical incisions are healing well.  He is walking twice a day.   PHYSICAL EXAMINATION:  Blood pressure 128/87, pulse 60, respirations 18,  saturation 97%.  He is alert and oriented and pleasant.  Breath sounds  are clear and equal.  There is no thoracic deformity.  The sternum is  stable and well-healed.  Cardiac rhythm is regular without S3 gallop or  murmur.  The leg incisions are healing well and there is no significant  edema at the ankles.   PA and lateral  chest x-ray shows clear lung fields, no pleural effusion,  the sternal wires are well-aligned, and the cardiac silhouette is  stable.   IMPRESSION/PLAN:  The patient has done well the first four weeks after  his surgery.  I told him he could resume driving and light daily  activities, but to avoid lifting more than 15 pounds.  I provided him  with another prescription for Ultram.  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   Kerin Perna, M.D.  Electronically Signed   PV/MEDQ  D:  10/09/2006  T:  10/10/2006  Job:  [id number]   cc:   Nicki Guadalajara, M.D.

## 2010-07-02 NOTE — Discharge Summary (Signed)
NAME:  Paul Moore, Paul Moore NO.:  [id number]   MEDICAL RECORD NO.:  [id number]          PATIENT TYPE:  INP   LOCATION:  2015                         FACILITY:  [facility]   PHYSICIAN:  Kerin Perna, M.D.  DATE OF BIRTH:  06-09-1950   DATE OF ADMISSION:  09/11/2006  DATE OF DISCHARGE:  09/19/2006                               DISCHARGE SUMMARY   HISTORY OF PRESENT ILLNESS:  The patient is a 60 year old, white male,  ex-smoker who was admitted this hospitalization in transfer from  [facility] for cardiac catheterization.  The patient has  a history of coronary artery disease having undergone a percutaneous  placement of a bare metal stent in the circumflex artery at [facility] in 2000.  He also has a history of aortoiliac  disease and had a right iliac stent placed in 2001, also done at [facility].  He has done well up until recently when he  developed exertional shortness of breath and burning chest pain.  These  symptoms would be relieved with rest.  He was admitted to [facility] on July 24, after a sustained episode of chest pain while  mowing his lawn.  He was found to have some nonspecific ST changes and  elevated troponins and was admitted to the hospital, placed on heparin  and nitroglycerin.  He remained clinically stable and was transferred to  [facility] for cardiac catheterization and possible  intervention.   PAST MEDICAL HISTORY:  1. Hypertension.  2. History of smoking history.  3. Peripheral vascular occlusive disease.   ALLERGIES:  No known drug allergies.   PAST SURGICAL HISTORY:  Incision and drainage of a perirectal abscess.   HOME MEDICATIONS:  Aspirin 81 mg daily.   For family history, social history, review of systems and physical exam,  please see the history and physical done at the time of admission.   HOSPITAL COURSE:  The patient was admitted for catheterization  in  transfer from [facility] and was found to have severe coronary  artery disease.  This included a 95% stenosis of the LAD diagonal, a 50%  stenosis of circumflex at the site of prior stent and multiple 80-90%  stenosis of the right coronary artery.  His ejection fraction was 55%  and his left ventricular end-diastolic pressure was measured at 10 mmHg.  There was no valvular abnormalities found at catheterization.  Due to  these findings, a cardiothoracic surgical consultation was obtained with  Dr. Kathlee Nations Trigt who evaluated the patient and studies and agreed  with recommendations to proceed with surgical revascularization.   PROCEDURE:  On September 14, 2006, the patient was taken to the operating  room and underwent coronary artery bypass grafting x5.  The following  grafts were placed:  Left internal mammary artery to the left anterior  descending; a saphenous vein graft to the diagonal; saphenous vein graft  to the obtuse marginal; and finally a sequential saphenous vein graft to  posterior descending and posterolateral coronary artery.  The patient  tolerated the  procedure well and was taken to the surgical intensive  care unit in stable condition.   HOSPITAL COURSE:  The patient has overall done quite well.  He was  weaned from the ventilator neurologically intact without difficulty.  He  was weaned from inotropic support also without difficulty.  All routine  lines, monitors and drainage devices have been discontinued.  He did  have some postoperative tachycardiac arrhythmias with ectopy and was  started on a course of amiodarone.  This, however, was due to the  development of an urticarial reaction.  His rhythm has maintained in  sinus, but does have some occasional ventricular ectopy.  This will be  monitored and he will be re-evaluated in this regard prior to actual  decision for discharge.  Overall, his laboratory values are stable.  His  most recent hemoglobin and  hematocrit dated September 18, 2006, is 11 and 32  respectively.  Electrolytes, BUN and creatinine are within normal  limits.  His capillary blood glucoses have been slightly elevated and he  has been treated with a sliding scale and Lantus insulin, but these have  been discontinued as he has stabilized and resumed his normal diet and  increasing activity level.  He has no previous history of diabetes  mellitus.  His incisions are all healing well without evidence of  infection.  Oxygen has been weaned and he maintains adequate saturations  on room air.  He is still somewhat edematous and will require further  diuresis as an outpatient for a short term.  Overall, the patient's  status is felt to be tentatively stable for discharge in the morning of  September 19, 2006, pending morning round re-evaluation.   DISCHARGE MEDICATIONS:  1. Aspirin 81 mg daily.  2. Lopressor 50 mg twice daily.  3. Lisinopril 10 mg daily.  4. Lipitor 40 mg daily.  5. Lasix 40 mg daily x5 days.  6. K-Dur 20 mEq daily x5 days.  7. For pain, Ultram 50 mg one every 6 hours as needed.   SPECIAL INSTRUCTIONS:  The patient received written instructions  regarding medications, activity, diet, wound care and followup.   FOLLOW UP:  Dr. Tresa Endo in 2 weeks.  Dr. Donata Clay on October 09, 2006, at  11:45 a.m. with a chest x-ray.   DISCHARGE DIAGNOSES:  1. Severe coronary artery disease, status post surgical      revascularization as described above.  2. Mild postoperative anemia.  3. Postoperative allergic reaction presumptively to amiodarone,      although he has also stopped some of his narcotic medications and      is uncertain if this was the actual medication causing the      reaction.  4. Previous bare metal stent placement in the circumflex artery.  5. History of aortoiliac disease, status post right common iliac      artery stent.  6. Hyperlipidemia.  7. Hypertension.  8. Pre-diabetes.   CONDITION ON DISCHARGE:   Stable and improving.      Rowe Clack, P.A.-C.      Kerin Perna, M.D.  Electronically Signed    WEG/MEDQ  D:  09/18/2006  T:  09/18/2006  Job:  [id number]   cc:   Gracelyn Nurse, M.D.  Nicki Guadalajara, M.D.  Antonieta Iba, MD

## 2010-07-02 NOTE — Op Note (Signed)
NAME:  Paul Moore, Paul Moore NO.:  [id number]   MEDICAL RECORD NO.:  [id number]          PATIENT TYPE:  INP   LOCATION:  2302                         FACILITY:  [facility]   PHYSICIAN:  Bedelia Person, M.D.        DATE OF BIRTH:  October 02, 1950   DATE OF PROCEDURE:  09/14/2006  DATE OF DISCHARGE:                               OPERATIVE REPORT   ECHOCARDIOGRAPHER:  Bedelia Person, M.D.   PROCEDURE:  Intraoperative transesophageal echocardiography.   INDICATIONS FOR PROCEDURE:  [name] is a patient with significant  coronary artery disease, scheduled for coronary artery bypass grafting.  He has had an acute coronary event in the past.  He has no history of  esophageal or gastric complications.   DESCRIPTION OF PROCEDURE:  The airway was secured after induction of  general anesthesia using an oroendotracheal tube.  An orogastric tube  was used to suction and stomach contents and then removed.  A  transesophageal echo probe was heavily lubricated and placed in a  sleeve, which was also lubricated, and placed blindly down the  oropharynx without resistance.  At the completion of the procedure, the  probe was removed.  There was no evidence for oropharyngeal damage.  The  probe was left in the neutral unflexed position during the bypass run.   Prebypass examination revealed the left ventricle to be normal  thickness.  There was a segmental defect of the lateral wall, which was  exhibiting moderate hypokinesis.  Overall, left ventricular function was  slightly depressed, estimated ejection fraction of around 40%.  The left  atrium was normal size.  The appendage was clean.  The septum was  intact.  The mitral valve had 2 leaflets, opened and closed  appropriately.  Color Doppler revealed no mitral insufficiency.  The  aortic valve had 3 leaflets.  No calcifications were noted.  All  leaflets opened and closed appropriately.  Color Doppler revealed no  aortic insufficiency.  Right heart  exam was normal, with the tricuspid  valve exhibiting mild insufficiency secondary to the Swan-Ganz catheter  across the valve.   The patient was placed on cardiopulmonary bypass and underwent coronary  artery bypass grafting to 4 sites.  At the completion of the bypass run,  3 mcg inotropic dopamine support was initiated.  After termination from  bypass, left ventricular function was dynamic.  The lateral wall no  longer exhibited hypokinesis.  There were no segmental defects detected.  The mitral valve continued to show no insufficiency.  The aortic valve  remained normal in appearance and function.  Right heart exam was  essentially unchanged from prebypass examination.           ______________________________  Bedelia Person, M.D.     LK/MEDQ  D:  09/14/2006  T:  09/14/2006  Job:  [id number]

## 2010-07-02 NOTE — Op Note (Signed)
NAME:  Paul Moore, Paul Moore NO.:  [id number]   MEDICAL RECORD NO.:  [id number]          PATIENT TYPE:  INP   LOCATION:  2302                         FACILITY:  [facility]   PHYSICIAN:  Kerin Perna, M.D.  DATE OF BIRTH:  1950-12-16   DATE OF PROCEDURE:  09/14/2006  DATE OF DISCHARGE:                               OPERATIVE REPORT   OPERATION:  1. Coronary artery bypass grafting x5 (left internal mammary artery to      left anterior descending, saphenous vein graft to diagonal,      saphenous vein graft to obtuse marginal, sequential saphenous vein      graft to posterior descending and posterolateral).  2. Endoscopic vein harvest of the greater saphenous vein from the left      leg.   PREOPERATIVE DIAGNOSIS:  Class IV post infarction unstable angina,  severe three-vessel coronary artery disease.   POSTOPERATIVE DIAGNOSIS:  Class IV post infarction unstable angina,  severe three-vessel coronary artery disease.   SURGEON:  Kerin Perna, M.D.   ASSISTANT:  Gershon Crane, P.A.-C   ANESTHESIA:  General.   INDICATIONS:  The patient is a 60 year old male with history of coronary  artery disease, status post a lateral MI and a circumflex stent placed  in 2001 at [facility].  He continued to smoke until  recently, when he developed chest pain and shortness of breath.  Cardiac  catheterization was performed after the patient was admitted and had a  slight rise in cardiac enzymes.  Dr. Landry Dyke diagnostic cath  demonstrated high-grade stenosis of the LAD diagonal, moderate stenosis  of the circumflex at the site of the previously placed stent, and high-  grade proximal segmental 90% lesions of the right coronary with disease  at the posterior-posterolateral bifurcation.  Overall EF was 45% to 50%.  He was felt to be candidate for surgical revascularization.   Prior to surgery, I examined the patient in his CCU room and reviewed  the results of cardiac  cath with the patient and family.  I discussed  the indications and expected benefits of coronary artery bypass surgery  for treatment of his coronary artery disease.  I reviewed the  alternatives to surgical therapy as well.  I discussed with the patient  and family the major aspects of the planned procedure including the  choice of conduit to include internal mammary artery and endoscopically  harvested saphenous vein, the location of the surgical incisions, the  use of general anesthesia and cardiopulmonary bypass, and the expected  postoperative hospital recovery.  I discussed with the patient the risks  to him of coronary artery bypass surgery including risks of MI, CVA,  bleeding, infection, and death.  He understood these implications for  the surgery and agree to proceed with operation as planned under what I  felt was an informed consent.   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  There was also some  scarring extending from the lateral to the inferior wall.  The TEE at  the onset of the operation showed EF to be 40%, but no significant MR  was noted.  Following revascularization, global LV function showed a  significant improvement.  No blood products were required for the  operation.  The vein was harvested entirely from the left leg and was  somewhat small, but that no incisions were made in the right leg due to  the patient's prior history of ischemia of his right leg and a prior  iliac stent.   PROCEDURE:  The patient was brought to the operating room and placed  supine on the operating room table, where general anesthesia was  induced.  The chest, abdomen and legs were prepped with Betadine and  draped as a sterile field.  A sternal incision was made as the saphenous  vein was harvested endoscopically from the left leg.  The left internal  mammary artery was harvested as a pedicle graft from  its origin at the  subclavian vessels; it was a good vessel with excellent flow.  The  sternal retractor was placed and the pericardium was opened and  suspended.  After the vein had been harvested and inspected and found be  adequate, pursestrings were placed in the ascending aorta and right  atrium and the patient was cannulated and placed on bypass.  The  coronaries were identified for grafting and cardioplegia catheters were  placed for both antegrade and retrograde coronary sinus cardioplegia.  The veins were prepared for the distal anastomoses and the patient was  cooled to 32 degrees.  The aortic crossclamp was applied.  Eight hundred  milliliters of cold blood cardioplegia were delivered in split doses  between the antegrade, aortic and the retrograde coronary sinus  catheters.  There was good cardioplegic arrest and septal temperature  dropped to less than 15 degrees.   The distal coronary anastomoses were then performed.  The first distal  anastomosis was to the right coronary system.  This consisted of a  sequential vein graft from the posterior descending and the  posterolateral.  The posterior descending was a 1.5-mm vessel with a  proximal 95% RCA stenosis segmentally.  A side-to-side anastomosis with  a reversed saphenous vein was sewn using running 7-0 Prolene with good  flow through the graft.  The second distal anastomosis was the  continuation of the sequential vein to the posterolateral branch of the  right coronary; this was a 1.5-mm vessel with a proximal 70% stenosis  and the end of the vein was sewn end-to-side with running 7-0 Prolene  and there was good flow through graft.  Cardioplegia was redosed.  The  third distal anastomosis was to the circumflex marginal branch past the  prior stent; this was a 1.5-mm vessel and had a proximal high-grade  stenosis through which a 1.5-mm probe would not pass.  A reversed  saphenous vein was sewn end-to-side with a running  7-0 Prolene and there  was good flow through the graft.  Cardioplegia was redosed.  The fourth  distal anastomosis was to the diagonal branch of the LAD.  This was a  fairly dominant anterolateral vessel that had diffuse plaque and the  anastomosis was placed in the distal third of the vessel, where it was a  1.5-mm vessel.  A reversed saphenous vein was sewn end-to-side at this  point with running 7-0 Prolene and there was good flow through the  graft.  Cardioplegia was redosed.  The fifth distal anastomosis was then  placed  in the distal third of the LAD.  The left IMA pedicle was brought  through an opening created in the left lateral pericardium and was  brought down onto the LAD and sewn end-to-side with a running 8-0  Prolene.  There was an excellent flow through the anastomosis after  briefly releasing the pedicle bulldog on the mammary artery.  The  pedicle was secured to the epicardium and the bulldog was reapplied.  Cardioplegia was redosed.   While the crossclamp was still in place, 3 proximal vein anastomoses  were performed on the ascending aorta using a 4-mm punch with a running  7-0 Prolene.  Prior to tying down the final proximal anastomosis, air  was vented from the coronaries with a dose of retrograde warm blood  cardioplegia and the usual de-airing maneuvers on bypass.  The  crossclamp was then removed.   The heart resumed a spontaneous rhythm.  Air was aspirated from the vein  grafts.  Cardioplegia catheters were removed.  The patient was rewarmed  to 37 degrees.  Temporary pacing wires were applied.  When the patient  was adequately rewarmed and reperfused, the lungs were ventilated and  the ventilator was resumed.  The patient was then weaned off bypass  without difficulty.  Cardiac output was 5-6 L per minute and echo showed  good global function.  The cannulas were removed and protamine was  administered.  There was no adverse reaction to the protamine.  The   mediastinum was irrigated with warm antibiotic irrigation.  The leg  incision was irrigated with a warm antibiotic irrigation.  The superior  mediastinal fat was closed over the aorta.  Two mediastinal and a left  pleural chest tube were placed and brought out through separate  incisions.  The sternum was closed with interrupted steel wire.  The  pectoralis fascia was closed with a running #1 Vicryl.  The subcutaneous  and skin layers were closed with a running Vicryl.  Total bypass time  was 131 minutes with a crossclamp time of 110 minutes.      Kerin Perna, M.D.  Electronically Signed     PV/MEDQ  D:  09/14/2006  T:  09/15/2006  Job:  [id number]   cc:   Nicki Guadalajara, M.D.  [facility] Office

## 2010-07-02 NOTE — Assessment & Plan Note (Signed)
OFFICE VISIT   STEPEN, PRINS  DOB:  1950-12-21                                        December 11, 2006  CHART #:  [id number]   PROBLEM LIST:  1. Status post CABG x5 September 14, 2006 for post-infarction unstable      angina.  2. Lateral wall MI in 2001.  3. Hypertension.  4. Right iliac stent in 2001.   HISTORY OF PRESENT ILLNESS:  Mr. Sur return for his final office visit  after undergoing urgent coronary artery bypass surgery earlier this  summer. He is now recovering well without angina or symptoms of CHF. He  does the elliptical machine for 2 miles or 20 minutes daily. He still  has some tenderness and soreness over the chest wall when he sneezes or  coughs but the surgical incisions are well healed. He is followed by Dr.  Mariah Milling at [facility]. He was recently  started back on Plavix and his aspirin was increased in 81 bid. He is  not taking any pain medications.   PHYSICAL EXAMINATION:  VITAL SIGNS:  Blood pressure 140/80, pulse 60,  respiratory rate 18. Saturation 94% on room air.  GENERAL:  Alert and pleasant.  LUNGS:  Breath sounds are clear and equal.  CHEST:  Sternum is well healed.  CARDIAC:  Regular rhythm without S3, S4, or rub.  ABDOMEN:  Soft.  EXTREMITIES:  Leg incision is well headed and there is no peripheral  edema.   IMPRESSION/PLAN:  Mr. Shepard is doing well, rehabilitating from cardiac  bypass surgery at his own home due to insurance reasons. He has no  cardiac symptoms. He is ready to return to work, however, his work as a  Marine scientist patient's at [employer]. I have him  a note to return to work on December 21, 2006 with limitations that he  should not work more than 4 straight night shifts in a row without a  break and that he should limit lifting to 50 pounds or less until the  first of the year and to 100 pounds or less until the end of April of  2009. Otherwise, he will  continue his current medications and return  here as needed.   Kerin Perna, M.D.  Electronically Signed   PV/MEDQ  D:  12/11/2006  T:  12/12/2006  Job:  [id number]   cc:   Antonieta Iba, MD

## 2010-07-02 NOTE — Cardiovascular Report (Signed)
Paul Moore, Paul Moore NO.:  [id number]   MEDICAL RECORD NO.:  [id number]          PATIENT TYPE:  INP   LOCATION:  2920                         FACILITY:  [facility]   PHYSICIAN:  Antonieta Iba, MD   DATE OF BIRTH:  May 29, 1950   DATE OF PROCEDURE:  09/11/2006  DATE OF DISCHARGE:                            CARDIAC CATHETERIZATION   PROCEDURES PERFORMED:  1. Coronary angiogram.  2. Left heart catheterization.  3. Left ventriculogram.  4. Aortic angiogram with lower extremity runoff.   PHYSICIANS:  1. Antonieta Iba, MD, PhD.  2. Nicki Guadalajara, M.D.   HISTORY:  The patient is a 60 year old male with a past medical history  of coronary artery disease, status post PCI of his left circumflex in  2000 at [facility] with a bare metal stent, peripheral vascular  disease with stents placed to his right common iliac in January 2001.  He presented to [facility] on September 10, 2006 with worsening  chest pressure.  He describes 2 to 3 months of chest pain with exertion  that presented at rest on September 10, 2006 while he was at his house.  The  initial troponin in the emergency room at the [facility]  were 0.7 with EKG showing T wave inversion in the lateral leads as well  as a 1 mm ST elevation anteriorly.  Following the second troponin 8  hours later rose to 4.1.  Secondary to the backlog in the Cath  Laboratory at the [facility] he was transferred to [facility] on the morning of September 11, 2006.  He had no further  episodes of chest pain overnight or through the course of the 25th.  A  third troponin level was 3.7 in the morning of September 11, 2006.  Peak CK-  MB was in the low 20s.   MEDICATIONS GIVEN:  1. Versed 1 mg IV push.  2. Fentanyl 25 mcg IV push x1.  3. Aspirin 325 mg x1.  4. Benadryl 25 mg p.o.   PROCEDURE IN DETAIL:  After a detailed description of the procedure  including the risks, benefits and potential  alternatives, informed  consent was obtained.  The patient was premedicated with Valium 5 mg  once and Benadryl 25 mg once p.o. and was brought to the cardiac  catheterization laboratory.  The patient was prepped and draped in the  usual sterile fashion.   Local anesthesia was obtained using 1% Lidocaine subcutaneous  infiltration. The right femoral artery was cannulated using modified  Seldinger technique and a #5 French introducer sheath was inserted.   Selective coronary angiography was then performed using a #5 [place]  Judkins left #4 catheter to engage the left main coronary artery and a  #5 [place] Judkins right #4 catheter to engage the right coronary artery.  The arteriography was performed using hand injections of contrast in  multiple projections.  Left ventriculography was performed by power  injection through a #5 French pigtail catheter.  This catheter was also  used for nonselective aortic angiography with runoff to examine  his  right common iliac stent.  Following the procedure, the arterial sheath  was removed and pressure held until hemostasis was obtained.  The total  amount of contrast used was 225 mL Omnipaque 350, 75 of which was  wasted.  There was 7 minutes of fluoroscopy time for the case.   FINDINGS:  1. Hemodynamics.  Aortic root blood pressure 100/60/76.  LV pressures      88/8/10.  Pullback ventricle pressures 112/8/10.  Pullback arterial      pressures 112/64/84.  2. Angiography.      a.     Left main coronary artery.  The left main coronary artery is       a moderate to large size vessel that bifurcates into the left       anterior descending artery and the left circumflex artery. The       artery has mild diffuse ostial and proximal disease.      b.     Left anterior descending. The left anterior descending       artery is a moderate size vessel giving rise to 2 diagonal       arteries.  There is greater than 95% proximal concentric stenosis       of  the LAD prior to the takeoff of a large bifurcating septal.       The first diagonal branch has greater than 90% ostial and proximal       disease at the location of the takeoff of the LAD stenoses.  The       diagonal vessel has TIMI 2 flow and demonstrates a moderate size       vessel that extends distally.      c.     Left circumflex.  A nondominant vessel that is moderate       size.  It gives rise to several moderately sized branching of       obtuse marginal artery.  There is mild in-stent restenosis of the       stent in the mid-circumflex.      d.     Right coronary artery.  The right coronary artery is a       moderate size dominant vessel with severe serial lesions in the       proximal region as well as moderate diffuse disease in the neck of       the distal RCA and PDA.      e.     Left ventricle.  Mildly depressed left ventricular function       on ventriculography with mild hypokinesis anteriorly as well as       inferiorly.  There is no mitral regurgitation appreciated, and no       evidence of aortic stenosis on pullback.   CONCLUSION:  Severe proximal disease of the LAD as well as proximal RCA  and as well as ostial and proximal diagonal vessel.  Ventriculography  showing anterior and inferior mild hypokinesis in the regions of severe  stenoses.   PLAN:  The case was discussed with Dr. Donata Clay, professor of  cardiothoracic surgery, and it was felt that he may be best served by  bypass surgery.  This decision was based on the fact that he had multi-  vessel disease, the difficulty in stenting the left anterior descending  stenoses which was at the location of a large bifurcating septal as well  as the takeoff of a moderate size diagonal vessel that  was as well  diseased with TIMI 2 flow. To intervene on each of these lesions would  have been complicated, as well as the entire procedure would have  required numerous stents.   The benefits of CT surgery were  discussed with the patient, and he was  in agreement with the plan for surgery.  He will discuss these options  over the weekend with Dr. Donata Clay and will be placed on the schedule  for surgery as schedule permits.  Following the cardiac catheterization  procedure, he will be admitted to the TCU, started on heparin 1000 units  per hour with no bolus and his other inpatient medications will be  continued.      Antonieta Iba, MD  Electronically Signed    TJG/MEDQ  D:  09/13/2006  T:  09/13/2006  Job:  [id number]   cc:   Kerin Perna, M.D.  Nicki Guadalajara, M.D.  Antonieta Iba, MD

## 2010-07-02 NOTE — Consult Note (Signed)
NAME:  Paul Moore, Paul Moore NO.:  [id number]   MEDICAL RECORD NO.:  [id number]          PATIENT TYPE:  INP   LOCATION:  2920                         FACILITY:  [facility]   PHYSICIAN:  Kerin Perna, M.D.  DATE OF BIRTH:  09-02-1950   DATE OF CONSULTATION:  09/12/2006  DATE OF DISCHARGE:                                 CONSULTATION   PRIORITY CARDIOTHORACIC SURGICAL CONSULTATION   REASON FOR CONSULTATION:  Severe three-vessel coronary artery disease,  Class IV unstable angina, subendocardial MI.   CHIEF COMPLAINT:  Chest pain.   HISTORY OF PRESENT ILLNESS:  I was asked to evaluate this 60 year old,  white male, ex-smoker for potential surgical coronary revascularization  for a recently diagnosed severe three-vessel coronary artery disease.  The patient has a history of coronary artery disease and underwent a PCI-  bare metal stent to the circumflex at [facility] in  2000.  In 2001, he had a right iliac stent placed at [facility].  He did well until recently when he developed exertional  shortness of breath and burning chest pain.  This would be relieved with  rest.  He was admitted to [facility] on July 24th after a  sustained episode of chest pain while he was mowing his lawn.  He had  some nonspecific ST segment changes and elevated troponin and was  admitted to the hospital and was placed on heparin and nitroglycerin.  He remained clinically stable and was transferred to [facility] for a  cardiac catheterization and possible intervention.  He did not receive  Plavix.  Left heart cath was performed July 25th, which demonstrated  severe three-vessel coronary artery disease with 95% stenosis of the LAD  diagonal, 50% stenosis of the circumflex at the site of the prior stent,  and multiple 80 to 90% stenoses of the right coronary.  His EF was 55%  and left ventricular end diastolic pressure measured at 10 mmHg.  There  is no  evidence of valvular insufficiency on cath.  The patient was  placed on IV heparin and he has remained stable in the CCU.   PAST MEDICAL HISTORY:  1. Hypertension.  2. History of smoking.  3. Peripheral vascular disease, status post right iliac stent in 2001      for hip claudication.  4. No known drug allergies.   PAST SURGICAL HISTORY:  I&D of perirectal abscess.   HOME MEDICATIONS:  Aspirin 81 mg daily.   SOCIAL HISTORY:  The patient stopped smoking on July 4th after one pack  per day.  He does not drink alcohol.  He is married and has two  children.   FAMILY HISTORY:  Positive for coronary artery disease, and his father  died at an early age of a heart attack.  There is no family history for  diabetes.   REVIEW OF SYSTEMS:  CONSTITUTIONAL REVIEW:  Negative for fever or weight  loss.  ENT REVIEW:  Negative for difficulty swallowing or active dental  problems.  THORACIC REVIEW:  Negative for history of chest trauma or  history of abnormal chest x-ray.  He stopped smoking three weeks ago,  and his productive cough in the morning has improved significantly.  He  denies shortness of breath.  CARDIAC REVIEW:  Positive for his recently  diagnosed severe three-vessel coronary disease with unstable angina.  No  history of valvular heart disease or a cardiac murmur.  ABDOMINAL  REVIEW:  Negative for hepatitis, jaundice, or blood per rectum.  UROLOGIC REVIEW:  Negative for BPH or kidney stones.  VASCULAR REVIEW:  Positive for claudication in the right hip, which was relieved by the  iliac stent.  Negative for DVT or TIA.  NEUROLOGIC REVIEW:  Negative for  stroke or seizure.  ENDOCRINE REVIEW:  Negative for diabetes or thyroid  disease.  HEMATOLOGIC REVIEW:  Negative for bleeding disorder or prior  blood transfusions.   PHYSICAL EXAMINATION:  VITAL SIGNS:  The patient is 5 foot 11 and weighs  197 pounds, blood pressure 120/70, pulse 80 and regular in a sinus  rhythm.  GENERAL:  He is  comfortable in the CCU.  HEENT:  Normocephalic, pupils are equal.  NECK:  Without JVD, masses, or carotid bruit.  LYMPHATICS:  No palpable supraclavicular or cervical adenopathy.  CHEST:  Breath sounds are clear and equal, and there is no thoracic  deformity.  CARDIAC:  Regular rhythm without S3 gallop or murmur.  ABDOMEN:  Soft and nontender without pulsatile mass.  EXTREMITIES:  No clubbing, cyanosis, or edema.  Peripheral pulses are 2+  in all extremities.  NEUROLOGIC:  Exam is intact.   LABORATORY DATA:  I have reviewed his coronary arteriograms and agree  with the impression of Dr. Tresa Endo and Dr. Lucien Mons that the patient has  severe multivessel disease and would be best treated with a surgical  revascularization with bypass grafts to the LAD, diagonal, circumflex  marginal, and distal PD-PL branches of the right coronary.  I have  discussed this plan with the patient and have reviewed the details of  surgery, and he understands and agrees.  The surgery will be scheduled  for Monday, July 28th.   Thank you for the consultation.      Kerin Perna, M.D.  Electronically Signed     PV/MEDQ  D:  09/12/2006  T:  09/12/2006  Job:  [id number]

## 2010-07-02 NOTE — Cardiovascular Report (Signed)
NAME:  SRIKAR, CHIANG NO.:  [id number]   MEDICAL RECORD NO.:  [id number]          PATIENT TYPE:  OIB   LOCATION:  2856                         FACILITY:  [facility]   PHYSICIAN:  Thereasa Solo. Little, M.D. DATE OF BIRTH:  Jun 11, 1950   DATE OF PROCEDURE:  01/08/2007  DATE OF DISCHARGE:  01/08/2007                            CARDIAC CATHETERIZATION   INDICATION FOR TEST:  This 60 year old male had bypass surgery July 2008  for three-vessel coronary disease.  At the time of his surgery, his  ejection fraction was mildly depressed at 45% to 50%.  He has not done  well after his surgery with continued problems of tiredness, fatigue and  dyspnea.  A nuclear study was performed that now shows only a 22%  ejection fraction and inferolateral ischemia.  Because of this, he was  brought in for repeat cardiac catheterization.   After obtaining informed consent, the patient was prepped and draped in  the usual sterile fashion, exposing the right groin.  Following local  anesthetic with 1% Xylocaine, the Seldinger technique was employed and a  5-French introducer sheath was placed in the right femoral artery.  Left  and right coronary arteriography, graft visualization and  ventriculography in the RAO projection was performed.   COMPLICATIONS:  None.   EQUIPMENT:  A JL4 catheter was used to evaluate the native left system  and a JR4 was used to evaluate the internal mammary artery.  An RCB was  used to evaluate the right coronary artery, the right coronary artery  bypass graft and the saphenous vein graft to the OM system.  A multitude  of catheters were used, and finally an AL3 catheter was used to engage  the ostium of the saphenous vein graft to the diagonal.  Intracoronary  nitroglycerin was given during the procedure to the saphenous vein graft  to the diagonal.   TOTAL CONTRAST:  140 mL.   RESULTS:  1. Hemodynamic monitoring:  His central aortic pressure was 128/76.  His left ventricular pressure was 126/6 and there was no aortic      valve gradient noted at the time of pullback.  2. Ventriculography:  Ventriculography in the RAO projection using 25      mL of contrast at 12 mL per second revealed marked dilatation of      left ventricular cavity and heavy trabeculation.  There was severe      LV dysfunction with global hypokinesis.  Ejection fraction was      around 20% and marked ectopy was noted during the ventriculogram.  3. Coronary arteriography:  On fluoroscopy, a stent was noted in the      distribution of the circumflex system.      a.     Left main had minor irregularities.      b.     LAD 100% occluded proximally but was grafted.      c.     Circumflex.  The first OM was patent and the stent in the       midportion of the OM was also patent.  There  was faint evidence of       bidirectional flow in the distal portion of the OM system.      d.     Right coronary artery.  The right coronary artery was a       large dominant vessel.  It was diffusely diseased.  It was       tortuous.  The proximal portion had a sequential 60% and 70% area       of narrowing.  These areas were surrounded by saccular areas of       dilatation and aneurysmal changes.  The entire proximal 2/3 of the       RCA had aneurysmal changes.  The PDA, which was a long vessel, was       subtotaled in its midportion and there were two posterolateral       vessels that were free of disease.   GRAFTS:  1. Internal mammary artery to the LAD.  The internal mammary artery      was widely patent.  The LAD was widely patent.  There was no      visualization of any diagonals off the LAD.  2. Saphenous vein graft to the RCA 100% occluded in its ostium.  3. Saphenous vein graft to the OM.  The graft itself was widely      patent.  There was a very large OM system that filled all the way      back into the ongoing circumflex system and retrograde into the OM      stent.  4.  Saphenous vein graft to the diagonal.  The ostium and proximal      segment for about 15 to 20 mm in length was 60% to 70% narrowed.      Intracoronary nitroglycerin made no improvement and cusp injections      well away from the ostium continued to show this ostial narrowing.      I do not think it was catheter-induced.  The diagonal itself was      widely patent.  Of note, it was very difficult to find a catheter      that would reach over to this graft.  Finally, an AL3 was used.   CONCLUSION:  1. Loss of the saphenous vein graft to the RCA with the native RCA      being diffusely diseased, markedly tortuous, with saccular      dilatation and aneurysmal changes, and the PDA had high-grade      stenosis in its midportion.  2. Moderate narrowing of the ostium and proximal segment of the      saphenous vein graft to the diagonal.  3. Widely patent grafts to the internal mammary artery and to the  OM      system.  4. Severe LV dysfunction with ejection fraction of approximately 20%.   DISCUSSION:  This is an extremely complicated situation.  He has severe  LV dysfunction and symptoms associated with tiredness and fatigue.  He  is currently on Lopressor 50 mg b.i.d. and lisinopril 20.  Increasing  his lisinopril and perhaps changing his beta blocker to Coreg may be of  some mild improvement in his LV dysfunction.  A biventricular AICD may  also be an option.   In regard to his coronary anatomy, the right coronary artery appears to  be responsible for the inferolateral ischemic changes but would be  extremely difficult and somewhat risky to try to treat percutaneously.  The two proximal lesions are probably doable with stenting, but the  dilatation and aneurysmal changes may make good coaptation of the stent  in the vessel wall difficult.  The distal PDA I do not think could be  fixed percutaneously.  The ostium of the saphenous vein graft to the  diagonal could be stented, but I am  concerned that a guide catheter will  be a big issue since I had a hard time just finding diagnostic catheters  to adequately visualize this.   My plan is to discuss this in detail with my interventional colleagues.  The patient has an appointment to follow up with Dr. Mariah Milling in 5 days to  go over his options.  I did discuss with the patient E[facility]P as a potential  alternative.           ______________________________  Thereasa Solo. Little, M.D.     ABL/MEDQ  D:  01/08/2007  T:  01/09/2007  Job:  [id number]   cc:   Antonieta Iba, MD  Cath Lab

## 2010-07-02 NOTE — Discharge Summary (Signed)
NAME:  Paul Moore, Paul Moore NO.:  [id number]   MEDICAL RECORD NO.:  [id number]          PATIENT TYPE:  OIB   LOCATION:  6527                         FACILITY:  [facility]   PHYSICIAN:  Nicki Guadalajara, M.D.     DATE OF BIRTH:  09-Aug-1950   DATE OF ADMISSION:  01/19/2007  DATE OF DISCHARGE:  01/20/2007                               DISCHARGE SUMMARY   DISCHARGE DIAGNOSES:  1. Coronary disease with elective native right coronary artery Promus      stenting this admission.  2. Coronary artery bypass grafting, July 2008.  3. Ischemic cardiomyopathy with an ejection fraction of 20%.  4. Treated dyslipidemia.   HOSPITAL COURSE:  Paul Moore is a 60 year old male with history of five-  vessel bypass in July 2008.  He had a catheterization in November 2008  because he had been having some dyspnea on exertion and fatigue.  Catheterization revealed an occluded vein graft to his RCA with native  RCA disease.  ER had dropped to 20%.  He was seen in the office in  followup.  Dr. Tresa Endo felt it would be best to proceed with attempt at  intervention to the RCA.  The patient was admitted for elective  intervention on January 19, 2007.  He had a Promus stent placed.  He did  have some residual distal PDA disease.  Overall, he tolerated the  procedure well.  We feel he can be discharged on January 20, 2007.  He  will follow up with Dr. Mariah Milling on Friday.   DISCHARGE MEDICATIONS:  1. Coated aspirin 325 mg a day.  2. Lipitor 80 mg a day.  3. Plavix 75 mg a day.  4. Coreg 25 mg twice a day.  5. Lisinopril/hydrochlorothiazide 20/12.5 daily.  6. Imdur 30 mg a day.  7. Nitroglycerin sublingual p.r.n.   LABORATORY DATA:  White count 8.5, hemoglobin 12.2, hematocrit 35.9,  platelets 202,000.  Sodium 136, potassium 3.7, BUN 9, creatinine 0.76.   DISPOSITION:  The patient is discharged in stable condition and will  follow up with Dr. Dossie Arbour on Friday.      Abelino Derrick, P.A.    ______________________________  Nicki Guadalajara, M.D.   Lenard Lance  D:  01/20/2007  T:  01/20/2007  Job:  [id number]   cc:   Antonieta Iba, MD

## 2010-07-02 NOTE — Cardiovascular Report (Signed)
NAME:  Paul Moore, Paul Moore NO.:  [id number]   MEDICAL RECORD NO.:  [id number]          PATIENT TYPE:  OIB   LOCATION:  2807                         FACILITY:  [facility]   PHYSICIAN:  Nicki Guadalajara, M.D.     DATE OF BIRTH:  March 08, 1950   DATE OF PROCEDURE:  01/19/2007  DATE OF DISCHARGE:                            CARDIAC CATHETERIZATION   INDICATIONS:  Paul Moore is a 60 year old gentleman who underwent  five-vessel CABG revascularization surgery on September 14, 2006 by Dr. Donata Clay for severe native coronary obstructive disease.  After developing  recurrent chest pain and with a nuclear scan demonstrating significant  ischemia, he underwent diagnostic cardiac catheterization by Dr. Clarene Duke  on January 08, 2007.  This revealed an occluded vein graft to the RCA  at the ostium as well as with evidence for severe diffuse native RCA  disease with 90% proximal, 90% mid and diffuse tortuosity with segmental  disease with high-grade PDA narrowing in small vessel.  He also had  moderate narrowing in the ostium of the proximal segment of vein graft  supplying the diagonal vessel.  He had patent LIMA graft and a patent  vein graft to his circumflex marginal system.  He had significant LV  dysfunction on ventriculogram.  After discussion with colleagues, the  patient was seen back by Dr. Mariah Milling and scheduled for attempt at opening  up the native right coronary artery for coronary intervention with me  today.   PROCEDURE:  After premedication with Versed 2 mg intravenous, the  patient was prepped and draped in the usual fashion.  Due to the marked  vessel tortuosity and the potential for more stiff catheters, the  decision was made to use a 7-French system for support.  Consequently, a  7-French arterial sheath was inserted without difficulty.  Again due to  the large RCA, it was felt that the patient may need a temporary  pacemaker, and for this reason prophylactically, a venous  sheath was  inserted.  The patient has been on Plavix therapy over the last several  weeks.  He was given double bolus Integrilin and 4800 units of weight-  adjusted heparin.  ACT was documented to be therapeutic.  Due to the  marked vessel tortuosity in the takeoff, it was felt that the patient  would require hockey-stick guide.  A 7-French hockey-stick guide with  side holes was used.  An Asahi medium wire was then advanced.  I was  able to navigate the proximal bend and navigate into the proximal  secondary bend but was unable to navigate further without additional  support.  A 2.0 x 12-mm Maverick balloon was then inserted, and this  provided the adequate support necessary for the wire to be passed down  to the distal RCA.  Initial inflations were made at the mid and proximal  site with this 2-0 x 12-mm Maverick balloon up to 10 to 12 atmospheres.  A 3.0 x 28-mm drug-eluting Promus stent was then successfully inserted  after careful attention to make certain that this would cover both  lesions.  This was dilated x2 to 13 and 14 atmospheres.  A 3.5 x 15-mm  Quantum balloon was used for post-stent dilatation with dilatation up to  3.33 mm.  Scout angiography confirmed an excellent angiographic result.  There was no change in the distal lesions which were not intervened  upon.  At this point, since the patient did have significant lesion in  the vein graft which was not well visualized at his diagnostic  catheterization due to the angle of the vessel takeoff and was probably  not intervened overall, several attempts were made to recannulate this  vein utilizing the hockey-stick guide and left bypass guide and an  Amplatz left II.  Visualization was demonstrated.  However, the graft  was not able to be selectively cannulated.  These injections were made  to further visualize the vein system, but there was no intention to  intervene on this graft unless the patient had significant  recurrent  symptomatology following today's intervention.  Arterial sheath was  sutured in place.  The patient had documented ACTs throughout the  procedure.   HEMODYNAMIC DATA:  Central aortic pressure is 133/81.  During the  procedure, the patient received several doses of intracoronary  nitroglycerin.   ANGIOGRAPHIC DATA:  The right coronary artery was a markedly tortuous  vessel that had 90-95% stenosis leading up to the very proximal bend.  This was after a proximal branch that had 80% ostial narrowing.  After  this proximal significant tortuous curve, the RCA was then 80-90%  diffusely stenosed before an anterior RV marginal branch.  There was  diffuse irregularity in the mid RCA with narrowing of 30% followed by an  area of ectasia proximal to the crux with 40% narrowing in the region of  the crux in the region of the acute margin branch.  There was 70-75%  stenosis in the acute margin branch in its mid way down in a small  vessel.  There was 40% diffuse narrowing in the distal RCA with 90%  narrowing eccentrically in the mid distal portion of a small PDA branch.  The distal RCA ended in a small posterolateral system.  Following  initial predilatation of the proximal and mid lesions with a 2-0  Maverick balloon with stenting with a 3.0 x 28-mm Promus stent covering  both lesions and post-stent dilatation up to 3.33 mm, the proximal  segment was reduced to 0%.  There was no evidence for dissection.  There  was brisk TIMI 3 flow.  There was no significant change in the distal  lesions.  Scout angiography of the vein graft supplying the diagonal  vessel not selectively engaged.  Again confirmed the ostial to proximal  narrowing of 60-70% in this graft.   IMPRESSION:  Difficult but successful percutaneous coronary intervention  with PTCA/stenting of a markedly tortuous native right coronary artery  with stenoses of 90-95% proximally and 80-90% in secondary bend of the  RCA being  reduced to 0% with ultimate insertion of a 3.0 x 28-mm drug-  eluting Promus stent postdilated to 3.3 mm done with Plavix, Integrilin,  IC nitroglycerin.           ______________________________  Nicki Guadalajara, M.D.     TK/MEDQ  D:  01/19/2007  T:  01/19/2007  Job:  [id number]   cc:   Antonieta Iba, MD  Clarene Duke, M.D.  Kerin Perna, M.D.  Nicki Guadalajara, M.D.

## 2010-07-04 ENCOUNTER — Ambulatory Visit (INDEPENDENT_AMBULATORY_CARE_PROVIDER_SITE_OTHER): Payer: Medicare Other | Admitting: *Deleted

## 2010-07-04 ENCOUNTER — Other Ambulatory Visit: Payer: Self-pay

## 2010-07-04 DIAGNOSIS — I428 Other cardiomyopathies: Secondary | ICD-10-CM

## 2010-07-04 DIAGNOSIS — I5022 Chronic systolic (congestive) heart failure: Secondary | ICD-10-CM

## 2010-07-10 NOTE — Progress Notes (Signed)
icd remote check w/icm  

## 2010-07-19 ENCOUNTER — Other Ambulatory Visit: Payer: Self-pay

## 2010-07-19 ENCOUNTER — Ambulatory Visit (INDEPENDENT_AMBULATORY_CARE_PROVIDER_SITE_OTHER): Payer: Medicare Other | Admitting: Cardiovascular Disease

## 2010-07-19 ENCOUNTER — Encounter: Payer: Self-pay | Admitting: Cardiovascular Disease

## 2010-07-19 DIAGNOSIS — I10 Essential (primary) hypertension: Secondary | ICD-10-CM

## 2010-07-19 DIAGNOSIS — G4730 Sleep apnea, unspecified: Secondary | ICD-10-CM

## 2010-07-19 DIAGNOSIS — Z95810 Presence of automatic (implantable) cardiac defibrillator: Secondary | ICD-10-CM

## 2010-07-19 DIAGNOSIS — I25810 Atherosclerosis of coronary artery bypass graft(s) without angina pectoris: Secondary | ICD-10-CM

## 2010-07-19 DIAGNOSIS — I739 Peripheral vascular disease, unspecified: Secondary | ICD-10-CM

## 2010-07-19 DIAGNOSIS — I2589 Other forms of chronic ischemic heart disease: Secondary | ICD-10-CM

## 2010-07-19 DIAGNOSIS — E785 Hyperlipidemia, unspecified: Secondary | ICD-10-CM

## 2010-07-19 DIAGNOSIS — Z79899 Other long term (current) drug therapy: Secondary | ICD-10-CM

## 2010-07-19 DIAGNOSIS — I4949 Other premature depolarization: Secondary | ICD-10-CM

## 2010-07-19 DIAGNOSIS — I5022 Chronic systolic (congestive) heart failure: Secondary | ICD-10-CM

## 2010-07-19 MED ORDER — LISINOPRIL-HYDROCHLOROTHIAZIDE 20-12.5 MG PO TABS: 1.0000 | ORAL_TABLET | Freq: Every day | ORAL | Status: DC

## 2010-07-19 MED ORDER — ISOSORBIDE MONONITRATE ER 30 MG PO TB24: 30.0000 mg | ORAL_TABLET | Freq: Every day | ORAL | Status: DC

## 2010-07-19 MED ORDER — CLOPIDOGREL BISULFATE 75 MG PO TABS: 75.0000 mg | ORAL_TABLET | Freq: Every day | ORAL | Status: DC

## 2010-07-19 MED ORDER — POTASSIUM CHLORIDE CRYS ER 20 MEQ PO TBCR: 20.0000 meq | EXTENDED_RELEASE_TABLET | Freq: Every day | ORAL | Status: DC

## 2010-07-19 MED ORDER — FUROSEMIDE 40 MG PO TABS: 40.0000 mg | ORAL_TABLET | Freq: Every day | ORAL | Status: DC

## 2010-07-19 NOTE — Assessment & Plan Note (Signed)
We will recheck his cholesterol today. Goal LDL less than 70.

## 2010-07-19 NOTE — Assessment & Plan Note (Signed)
He appears to be well compensated at this time. He has had difficulty tolerating beta blockers secondary to dizziness. We'll continue his current medication regimen. He feels well, goes to the gym frequently. Occasionally he has shortness of breath and we have suggested he take extra furosemide as needed.

## 2010-07-19 NOTE — Assessment & Plan Note (Signed)
Currently with no symptoms of angina. No further workup at this time. Continue current medication regimen. Rare atypical type chest pain with stress at rest is likely noncardiac

## 2010-07-19 NOTE — Progress Notes (Signed)
Patient ID: Paul Moore, male    DOB: [birth year], 60 y.o.   MRN: [id number]  HPI Comments: Paul Moore is a 60 year old gentleman with a history of coronary artery disease, bypass x5 in July 2008, catheterization in December 2008 for recurrent chest pain with stents to his native RCA, occluded vein graft to the PDA, 60-70% proximal vein graft disease to the diagonal,  s/p ICD implantation for primary prevention by [facility], presents for routine followup.   He continues to work out most days of the week at the gym. If he pushes himself too hard, he is very fatigued for the next day or 2 and typically does not work out. Occasionally he has shortness of breath at rest and with lying down. He does not take extra furosemide. He reports his blood pressure has been well-controlled. He does have very cold feet on a regular basis. He does have rare chest pain though this presents with stress at rest and typically not with exertion. Previous visits, we decreased his Crestor from 20 mg to 10 mg. LDL in December was slightly above goal   echocardiogram in 2009 shows ejection fraction 20-30%, mildly dilated left ventricle, moderately enlarged right ventricle with moderately decreased systolic function.   Last stress test in November 2008, treadmill where he exercised only for 3 minutes, 5 minutes. The large region of decreased perfusion in the inferior and lateral walls no ischemia, ejection fraction 22%.   Last catheterization December 2008 with a PTCA and stenting of the RCA, Promus stent 3.0 x 28 mm. occluded vein graft to the RCA at the ostium, severe diffuse native RCA disease with 90% proximal, 90% mid, high-grade PDA narrowing in a small vessel, moderate stenosis at the ostium of the vein graft to the diagonal, patent LIMA and patent vein graft to the circumflex  EKG shows normal sinus rhythm with rate 86 beats per minute with bigeminal PVC rhythm       Review of Systems  Constitutional:  Positive for fatigue.  HENT: Negative.   Eyes: Negative.   Respiratory: Positive for shortness of breath.   Cardiovascular: Negative.   Gastrointestinal: Negative.   Musculoskeletal: Negative.   Skin: Negative.   Neurological: Negative.   Hematological: Negative.   Psychiatric/Behavioral: Negative.   All other systems reviewed and are negative.    BP 140/90  Pulse 86  Ht 5\' 7"  (1.702 m)  Wt 213 lb (96.616 kg)  BMI 33.36 kg/m2   Physical Exam  Nursing note and vitals reviewed. Constitutional: He is oriented to person, place, and time. He appears well-developed and well-nourished.  HENT:  Head: Normocephalic.  Nose: Nose normal.  Mouth/Throat: Oropharynx is clear and moist.  Eyes: Conjunctivae are normal. Pupils are equal, round, and reactive to light.  Neck: Normal range of motion. Neck supple. No JVD present.  Cardiovascular: Normal rate, regular rhythm, S1 normal, S2 normal, normal heart sounds and intact distal pulses.  Exam reveals no gallop and no friction rub.   No murmur heard. Pulmonary/Chest: Effort normal and breath sounds normal. No respiratory distress. He has no wheezes. He has no rales. He exhibits no tenderness.  Abdominal: Soft. Bowel sounds are normal. He exhibits no distension. There is no tenderness.  Musculoskeletal: Normal range of motion. He exhibits no edema and no tenderness.  Lymphadenopathy:    He has no cervical adenopathy.  Neurological: He is alert and oriented to person, place, and time. Coordination normal.  Skin: Skin is warm and dry. No rash noted.  No erythema.  Psychiatric: He has a normal mood and affect. His behavior is normal. Judgment and thought content normal.           Assessment and Plan

## 2010-07-19 NOTE — Assessment & Plan Note (Signed)
He is not taking his medications today. Typically blood pressure is well-controlled at home. No changes made to the medications.

## 2010-07-19 NOTE — Patient Instructions (Signed)
You are doing well. No medication changes were made. Please call us if you have new issues that need to be addressed before your next appt.  We will call you for a follow up Appt. In 6 months  

## 2010-07-19 NOTE — Assessment & Plan Note (Signed)
He is asymptomatic from his PVCs. He does not tolerate beta blockers. We will continue to monitor his rhythm for now. He does have ICD in place.

## 2010-07-19 NOTE — Assessment & Plan Note (Signed)
Continue aggressive lipid management  for his peripheral vascular disease.

## 2010-07-20 LAB — HEPATIC FUNCTION PANEL
Albumin: 5 g/dL (ref 3.5–5.2)
Alkaline Phosphatase: 74 U/L (ref 39–117)
Total Bilirubin: 0.5 mg/dL (ref 0.3–1.2)

## 2010-07-20 LAB — LIPID PANEL
Cholesterol: 165 mg/dL (ref 0–200)
HDL: 47 mg/dL (ref >39)
Total CHOL/HDL Ratio: 3.5 Ratio

## 2010-07-22 ENCOUNTER — Encounter: Payer: Self-pay | Admitting: Cardiovascular Disease

## 2010-07-25 ENCOUNTER — Encounter: Payer: Self-pay | Admitting: *Deleted

## 2010-10-02 ENCOUNTER — Ambulatory Visit (INDEPENDENT_AMBULATORY_CARE_PROVIDER_SITE_OTHER): Payer: Medicare Other | Admitting: Internal Medicine

## 2010-10-02 ENCOUNTER — Encounter: Payer: Self-pay | Admitting: Internal Medicine

## 2010-10-02 DIAGNOSIS — I5022 Chronic systolic (congestive) heart failure: Secondary | ICD-10-CM

## 2010-10-02 DIAGNOSIS — I10 Essential (primary) hypertension: Secondary | ICD-10-CM

## 2010-10-02 DIAGNOSIS — I2589 Other forms of chronic ischemic heart disease: Secondary | ICD-10-CM

## 2010-10-02 DIAGNOSIS — Z95810 Presence of automatic (implantable) cardiac defibrillator: Secondary | ICD-10-CM

## 2010-10-02 DIAGNOSIS — I4949 Other premature depolarization: Secondary | ICD-10-CM

## 2010-10-02 DIAGNOSIS — Z79899 Other long term (current) drug therapy: Secondary | ICD-10-CM

## 2010-10-02 MED ORDER — ISOSORBIDE MONONITRATE ER 60 MG PO TB24: 60.0000 mg | ORAL_TABLET | Freq: Every day | ORAL | Status: DC

## 2010-10-02 NOTE — Assessment & Plan Note (Signed)
As above.

## 2010-10-02 NOTE — Assessment & Plan Note (Signed)
Conitinue current meds, but am concernted there might be progressive CAD.. Will do the above, and if symptoms persist, maybe cath   will arrange for him to see Dr Mariah Milling sooner

## 2010-10-02 NOTE — Patient Instructions (Signed)
Your physician has recommended you make the following change in your medication: Increase Imdur to 60mg  once daily.   We will call you with your lab results.  Your physician recommends that you schedule a follow-up appointment in: Change appt to 4-5 weeks for Dr. [name] and change Dr. Windell Hummingbird f/u to 8 weeks f/u

## 2010-10-02 NOTE — Progress Notes (Signed)
  HPI  Paul Moore is a 60 y.o. male Seen in followup for ICD implanted for primary prevention in the setting  of coronary artery disease, bypass x5 in July 2009  HIs complaints are of sporadic and unpredictable DOE, PND and orthopnea without peripheral edema, and some chest pressure. ;   ECG from 6/12 demonstrated bigeminy.  Previously recognized   echocardiogram in 2009 shows ejection fraction 20-30%, mildly dilated left ventricle, moderately enlarged right ventricle with moderately decreased systolic function.  Last stress test in November 2008, treadmill where he exercised only for 3 minutes, 5 minutes. The large region of decreased perfusion in the inferior and lateral walls no ischemia, ejection fraction 22%.  Last catheterization December 2008 with a PTCA and stenting of the RCA, Promus stent 3.0 x 28 mm. occluded vein graft to the RCA at the ostium, severe diffuse native RCA disease with 90% proximal, 90% mid, high-grade PDA narrowing in a small vessel, moderate stenosis at the ostium of the vein graft to the diagonal, patent LIMA and patent vein graft to the circumflex  Past Medical History  Diagnosis Date  . Hypertension   . PVD (peripheral vascular disease)   . Ischemic cardiomyopathy   . Dyslipidemia   . Coronary artery disease     Past Surgical History  Procedure Date  . Coronary artery bypass graft   . Incise and drain abcess     perirectal  . Iliac artery stent     right  . Cardiac defibrillator placement     Current Outpatient Prescriptions  Medication Sig Dispense Refill  . aspirin 325 MG tablet Take 325 mg by mouth daily.        . clopidogrel (PLAVIX) 75 MG tablet Take 1 tablet (75 mg total) by mouth daily.  30 tablet  6  . furosemide (LASIX) 40 MG tablet Take 1 tablet (40 mg total) by mouth daily.  90 tablet  3  . isosorbide mononitrate (IMDUR) 30 MG 24 hr tablet Take 1 tablet (30 mg total) by mouth daily.  90 tablet  3  . lisinopril-hydrochlorothiazide  (PRINZIDE,ZESTORETIC) 20-12.5 MG per tablet Take 1 tablet by mouth daily.  90 tablet  3  . potassium chloride SA (K-DUR,KLOR-CON) 20 MEQ tablet Take 1 tablet (20 mEq total) by mouth daily.  30 tablet  6  . rosuvastatin (CRESTOR) 20 MG tablet Take 20 mg by mouth daily.          Allergies  Allergen Reactions  . Amiodarone     REACTION: Hives    Review of Systems negative except from HPI and PMH  Physical Exam Well developed and well nourished in no acute distress HENT normal E scleral and icterus clear Neck Supple JVP 7-8; carotids brisk and full Clear to ausculation Regular rate and rhythm, no murmurs gallops or rub Soft with active bowel sounds No clubbing cyanosis and edema Alert and oriented, grossly normal motor and sensory function Skin Warm and Dry    Assessment and  Plan

## 2010-10-02 NOTE — Assessment & Plan Note (Signed)
As a above 

## 2010-10-02 NOTE — Assessment & Plan Note (Signed)
The patient's device was interrogated.  The information was reviewed. No changes were made in the programming.    

## 2010-10-02 NOTE — Assessment & Plan Note (Addendum)
significnat symptoms.  The sporadic nature suggests that the PVC seen previously but not today may be contributing.  Will use a monitor and calendar to try to identify correlation  With nocturnal dyspnea and elevated blood pressure, will increase his imdur 30-60 and take at night  cotniue diuretics and measure K Mg;  Also will check BNP  optivol was elevated in June but back to baseline

## 2010-10-03 LAB — BASIC METABOLIC PANEL
BUN: 18 mg/dL (ref 6–23)
Calcium: 9.4 mg/dL (ref 8.4–10.5)
Glucose, Bld: 116 mg/dL — ABNORMAL HIGH (ref 70–99)

## 2010-10-03 LAB — BRAIN NATRIURETIC PEPTIDE: Brain Natriuretic Peptide: 57.7 pg/mL (ref 0.0–100.0)

## 2010-11-01 ENCOUNTER — Other Ambulatory Visit: Payer: Self-pay

## 2010-11-01 DIAGNOSIS — Z79899 Other long term (current) drug therapy: Secondary | ICD-10-CM

## 2010-11-01 DIAGNOSIS — I25810 Atherosclerosis of coronary artery bypass graft(s) without angina pectoris: Secondary | ICD-10-CM

## 2010-11-01 DIAGNOSIS — I2589 Other forms of chronic ischemic heart disease: Secondary | ICD-10-CM

## 2010-11-01 DIAGNOSIS — R0602 Shortness of breath: Secondary | ICD-10-CM

## 2010-11-01 DIAGNOSIS — Z95810 Presence of automatic (implantable) cardiac defibrillator: Secondary | ICD-10-CM

## 2010-11-01 DIAGNOSIS — I4949 Other premature depolarization: Secondary | ICD-10-CM

## 2010-11-05 ENCOUNTER — Encounter: Payer: Self-pay | Admitting: Internal Medicine

## 2010-11-07 ENCOUNTER — Ambulatory Visit (INDEPENDENT_AMBULATORY_CARE_PROVIDER_SITE_OTHER): Payer: Medicare Other | Admitting: Internal Medicine

## 2010-11-07 ENCOUNTER — Other Ambulatory Visit (INDEPENDENT_AMBULATORY_CARE_PROVIDER_SITE_OTHER): Payer: Medicare Other | Admitting: *Deleted

## 2010-11-07 ENCOUNTER — Encounter: Payer: Self-pay | Admitting: Internal Medicine

## 2010-11-07 DIAGNOSIS — Z79899 Other long term (current) drug therapy: Secondary | ICD-10-CM

## 2010-11-07 DIAGNOSIS — I5022 Chronic systolic (congestive) heart failure: Secondary | ICD-10-CM

## 2010-11-07 DIAGNOSIS — R0602 Shortness of breath: Secondary | ICD-10-CM

## 2010-11-07 DIAGNOSIS — Z95810 Presence of automatic (implantable) cardiac defibrillator: Secondary | ICD-10-CM

## 2010-11-07 DIAGNOSIS — I4949 Other premature depolarization: Secondary | ICD-10-CM

## 2010-11-07 DIAGNOSIS — I2589 Other forms of chronic ischemic heart disease: Secondary | ICD-10-CM

## 2010-11-07 DIAGNOSIS — I25810 Atherosclerosis of coronary artery bypass graft(s) without angina pectoris: Secondary | ICD-10-CM

## 2010-11-07 LAB — ICD DEVICE OBSERVATION
AL AMPLITUDE: 4.0533 mv
AL IMPEDENCE ICD: 536 Ohm
ATRIAL PACING ICD: 54.76 pct
CHARGE TIME: 9.989 s
RV LEAD IMPEDENCE ICD: 544 Ohm
RV LEAD THRESHOLD: 1 V
TOT-0002: 0
TOT-0006: 20090326000000
TZAT-0001ATACH: 1
TZAT-0001ATACH: 2
TZAT-0001FASTVT: 1
TZAT-0002ATACH: NEGATIVE
TZAT-0002ATACH: NEGATIVE
TZAT-0002FASTVT: NEGATIVE
TZAT-0012ATACH: 150 ms
TZAT-0012FASTVT: 200 ms
TZAT-0018ATACH: NEGATIVE
TZAT-0018ATACH: NEGATIVE
TZAT-0018ATACH: NEGATIVE
TZAT-0018SLOWVT: NEGATIVE
TZAT-0019ATACH: 6 V
TZAT-0019ATACH: 6 V
TZAT-0019SLOWVT: 8 V
TZAT-0020ATACH: 1.5 ms
TZAT-0020FASTVT: 1.5 ms
TZAT-0020SLOWVT: 1.5 ms
TZON-0003ATACH: 350 ms
TZON-0004VSLOWVT: 32
TZON-0005SLOWVT: 12
TZST-0001ATACH: 4
TZST-0001ATACH: 5
TZST-0001FASTVT: 3
TZST-0001FASTVT: 4
TZST-0001FASTVT: 5
TZST-0001SLOWVT: 4
TZST-0001SLOWVT: 6
TZST-0002ATACH: NEGATIVE
TZST-0002ATACH: NEGATIVE
TZST-0002FASTVT: NEGATIVE
TZST-0002FASTVT: NEGATIVE
TZST-0002FASTVT: NEGATIVE
TZST-0003SLOWVT: 15 J
TZST-0003SLOWVT: 35 J
TZST-0003SLOWVT: 35 J
VF: 0

## 2010-11-07 MED ORDER — ASPIRIN 81 MG PO TABS: 81.0000 mg | ORAL_TABLET | Freq: Every day | ORAL | Status: AC

## 2010-11-07 NOTE — Patient Instructions (Signed)
Your physician has recommended you make the following change in your medication: DECREASE Aspirin to 81mg  daily  Your physician recommends that you schedule a follow-up appointment in: Dr. [name] in 1 year

## 2010-11-07 NOTE — Assessment & Plan Note (Signed)
Improved on revised medical regime

## 2010-11-07 NOTE — Assessment & Plan Note (Signed)
The patient's device was interrogated.  The information was reviewed. No changes were made in the programming.    

## 2010-11-07 NOTE — Progress Notes (Signed)
  HPI  Paul Moore is a 60 y.o. male Seen in followup for ICD implanted for primary prevention in the setting  of coronary artery disease, bypass x5 in July 2009  HIs complaints at his last office visit were of sporadic and unpredictable DOE, PND and orthopnea without peripheral edema, and some chest pressure predominately at night. We had him increase his iisosorbide mononitrate and take it at night and he has had near full resolution of his symptoms. Her rest the other concern was his ventricular ectopy burden. Quantitation via his ICD demonstrates about 6 per minute or about 7000 per day. ;   ECG from 6/12 demonstrated bigeminy.  Previously recognized   echocardiogram in 2009 shows ejection fraction 20-30%, mildly dilated left ventricle, moderately enlarged right ventricle with moderately decreased systolic function.  Last stress test in November 2008, treadmill where he exercised only for 3 minutes, 5 minutes. The large region of decreased perfusion in the inferior and lateral walls no ischemia, ejection fraction 22%.  Last catheterization December 2008 with a PTCA and stenting of the RCA, Promus stent 3.0 x 28 mm. occluded vein graft to the RCA at the ostium, severe diffuse native RCA disease with 90% proximal, 90% mid, high-grade PDA narrowing in a small vessel, moderate stenosis at the ostium of the vein graft to the diagonal, patent LIMA and patent vein graft to the circumflex  Past Medical History  Diagnosis Date  . Hypertension   . PVD (peripheral vascular disease)   . Ischemic cardiomyopathy   . Dyslipidemia   . Coronary artery disease     Past Surgical History  Procedure Date  . Coronary artery bypass graft   . Incise and drain abcess     perirectal  . Iliac artery stent     right  . Cardiac defibrillator placement     Current Outpatient Prescriptions  Medication Sig Dispense Refill  . aspirin 325 MG tablet Take 325 mg by mouth daily.        . clopidogrel (PLAVIX) 75  MG tablet Take 1 tablet (75 mg total) by mouth daily.  30 tablet  6  . furosemide (LASIX) 40 MG tablet Take 1 tablet (40 mg total) by mouth daily.  90 tablet  3  . isosorbide mononitrate (IMDUR) 30 MG 24 hr tablet Take 1 tablet (30 mg total) by mouth daily.  90 tablet  3  . lisinopril-hydrochlorothiazide (PRINZIDE,ZESTORETIC) 20-12.5 MG per tablet Take 1 tablet by mouth daily.  90 tablet  3  . potassium chloride SA (K-DUR,KLOR-CON) 20 MEQ tablet Take 1 tablet (20 mEq total) by mouth daily.  30 tablet  6  . rosuvastatin (CRESTOR) 20 MG tablet Take 20 mg by mouth daily.          Allergies  Allergen Reactions  . Amiodarone     REACTION: Hives    Review of Systems negative except from HPI and PMH  Physical Exam Well developed and well nourished in no acute distress HENT normal E scleral and icterus clear Neck Supple JVP 7-8; carotids brisk and full Clear to ausculation Regular rate and rhythm, no murmurs gallops or rub Soft with active bowel sounds No clubbing cyanosis and edema Alert and oriented, grossly normal motor and sensory function Skin Warm and Dry    Assessment and  Plan

## 2010-11-07 NOTE — Assessment & Plan Note (Signed)
As above; I don't think the volume of them is sufficient at this time to justify specific targeted therapy

## 2010-11-07 NOTE — Assessment & Plan Note (Signed)
We will decrease his aspirin from 325-81. I Will ask him to discuss with Dr. Mariah Milling as to whether he needs to continue on Plavix.

## 2010-11-08 ENCOUNTER — Telehealth: Payer: Self-pay | Admitting: *Deleted

## 2010-11-08 MED ORDER — ISOSORBIDE MONONITRATE ER 30 MG PO TB24: 60.0000 mg | ORAL_TABLET | Freq: Every day | ORAL | Status: DC

## 2010-11-08 NOTE — Telephone Encounter (Signed)
Pt in office yesterday, requested refill on isosorbide. We had recently changed to 2 tabs daily and he will run out before next refill.

## 2010-11-09 LAB — BASIC METABOLIC PANEL
CO2: 23 mEq/L (ref 19–32)
Calcium: 9.2 mg/dL (ref 8.4–10.5)
Creat: 1.02 mg/dL (ref 0.50–1.35)

## 2010-11-09 LAB — MAGNESIUM: Magnesium: 2 mg/dL (ref 1.5–2.5)

## 2010-11-25 LAB — CARDIAC PANEL(CRET KIN+CKTOT+MB+TROPI)
Relative Index: INVALID
Relative Index: INVALID
Total CK: 80
Troponin I: 0.05

## 2010-11-25 LAB — BASIC METABOLIC PANEL
Calcium: 8.4
GFR calc Af Amer: >60
GFR calc non Af Amer: >60
Sodium: 136

## 2010-11-25 LAB — CBC
Hemoglobin: 12.2 — ABNORMAL LOW
RBC: 3.96 — ABNORMAL LOW
RDW: 15.1

## 2010-11-27 ENCOUNTER — Encounter: Payer: Self-pay | Admitting: Cardiovascular Disease

## 2010-11-27 ENCOUNTER — Ambulatory Visit (INDEPENDENT_AMBULATORY_CARE_PROVIDER_SITE_OTHER): Payer: Medicare Other | Admitting: Cardiovascular Disease

## 2010-11-27 DIAGNOSIS — Z95810 Presence of automatic (implantable) cardiac defibrillator: Secondary | ICD-10-CM

## 2010-11-27 DIAGNOSIS — I10 Essential (primary) hypertension: Secondary | ICD-10-CM

## 2010-11-27 DIAGNOSIS — E785 Hyperlipidemia, unspecified: Secondary | ICD-10-CM

## 2010-11-27 DIAGNOSIS — I5022 Chronic systolic (congestive) heart failure: Secondary | ICD-10-CM

## 2010-11-27 DIAGNOSIS — I25810 Atherosclerosis of coronary artery bypass graft(s) without angina pectoris: Secondary | ICD-10-CM

## 2010-11-27 DIAGNOSIS — N529 Male erectile dysfunction, unspecified: Secondary | ICD-10-CM

## 2010-11-27 DIAGNOSIS — I4949 Other premature depolarization: Secondary | ICD-10-CM

## 2010-11-27 NOTE — Progress Notes (Signed)
Patient ID: Paul Moore, male    DOB: 03-Feb-1951, 60 y.o.   MRN: [id number]  HPI Comments: Paul Moore is a 60 year old gentleman with a history of coronary artery disease, bypass x5 in July 2008, catheterization in December 2008 for recurrent chest pain with stents to his native RCA, occluded vein graft to the PDA, 60-70% proximal vein graft disease to the diagonal,  s/p ICD implantation for primary prevention by [facility], presents for routine followup.   Overall, Paul Moore is doing well. He has a long-standing issue with stabbing back pain that comes for several seconds at a time, typically at rest. There has been no change in frequency or intensity of his symptoms. Again they are nonexertional.  He does have occasional episodes of shortness of breath that typically come on at rest. He exercises on a regular basis and feels well. His shortness of breath episodes are fleeting, with no escalation of frequency or symptoms.  He continues to have trouble with his weight. His cholesterol has been slowly climbing over the past 2 years.  Recent blood work including basic metabolic panel and BNP were normal.   echocardiogram in 2009 shows ejection fraction 20-30%, mildly dilated left ventricle, moderately enlarged right ventricle with moderately decreased systolic function.   Last stress test in November 2008, treadmill where he exercised only for 3 minutes, 5 minutes. The large region of decreased perfusion in the inferior and lateral walls no ischemia, ejection fraction 22%.   Last catheterization December 2008 with a PTCA and stenting of the RCA, Promus stent 3.0 x 28 mm. occluded vein graft to the RCA at the ostium, severe diffuse native RCA disease with 90% proximal, 90% mid, high-grade PDA narrowing in a small vessel, moderate stenosis at the ostium of the vein graft to the diagonal, patent LIMA and patent vein graft to the circumflex  EKG shows normal sinus rhythm with rate 73 beats  per minute with bigeminal PVC rhythm     Outpatient Encounter Prescriptions as of 11/27/2010  Medication Sig Dispense Refill  . aspirin 81 MG tablet Take 1 tablet (81 mg total) by mouth daily.  30 tablet  0  . clopidogrel (PLAVIX) 75 MG tablet Take 1 tablet (75 mg total) by mouth daily.  30 tablet  6  . furosemide (LASIX) 40 MG tablet Take 1 tablet (40 mg total) by mouth daily.  90 tablet  3  . isosorbide mononitrate (IMDUR) 30 MG 24 hr tablet Take 2 tablets (60 mg total) by mouth daily. Take 2 tablets at bed time  60 tablet  6  . lisinopril-hydrochlorothiazide (PRINZIDE,ZESTORETIC) 20-12.5 MG per tablet Take 1 tablet by mouth daily.  90 tablet  3  . potassium chloride SA (K-DUR,KLOR-CON) 20 MEQ tablet Take 1 tablet (20 mEq total) by mouth daily.  30 tablet  6  . rosuvastatin (CRESTOR) 20 MG tablet Take 20 mg by mouth daily.           Review of Systems  HENT: Negative.   Eyes: Negative.   Respiratory:       Occasional episodes of brief shortness of breath at rest  Cardiovascular: Negative.   Gastrointestinal: Negative.   Musculoskeletal: Negative.        Occasional stabbing back pain  Skin: Negative.   Neurological: Negative.   Hematological: Negative.   Psychiatric/Behavioral: Negative.   All other systems reviewed and are negative.    BP 133/74  Pulse 76  Ht 5\' 7"  (1.702 m)  Wt 218 lb  8 oz (99.111 kg)  BMI 34.22 kg/m2   Physical Exam  Nursing note and vitals reviewed. Constitutional: He is oriented to person, place, and time. He appears well-developed and well-nourished.       obese  HENT:  Head: Normocephalic.  Nose: Nose normal.  Mouth/Throat: Oropharynx is clear and moist.  Eyes: Conjunctivae are normal. Pupils are equal, round, and reactive to light.  Neck: Normal range of motion. Neck supple. No JVD present.  Cardiovascular: Normal rate, regular rhythm, S1 normal, S2 normal, normal heart sounds and intact distal pulses.  Exam reveals no gallop and no friction  rub.   No murmur heard. Pulmonary/Chest: Effort normal and breath sounds normal. No respiratory distress. He has no wheezes. He has no rales. He exhibits no tenderness.  Abdominal: Soft. Bowel sounds are normal. He exhibits no distension. There is no tenderness.  Musculoskeletal: Normal range of motion. He exhibits no edema and no tenderness.  Lymphadenopathy:    He has no cervical adenopathy.  Neurological: He is alert and oriented to person, place, and time. Coordination normal.  Skin: Skin is warm and dry. No rash noted. No erythema.  Psychiatric: He has a normal mood and affect. His behavior is normal. Judgment and thought content normal.           Assessment and Plan

## 2010-11-27 NOTE — Assessment & Plan Note (Signed)
Followed by Dr. Klein.  Functioning well. 

## 2010-11-27 NOTE — Patient Instructions (Addendum)
You are doing well. No medication changes were made. We will schedule you for a cholesterol check Please call us if you have new issues that need to be addressed before your next appt.  We will call you for a follow up Appt. In 6 months  Your physician recommends that you return for a FASTING lipid profile: Next week:

## 2010-11-27 NOTE — Assessment & Plan Note (Signed)
We have suggested he have his cholesterol checked. It was elevated several months ago and he increased his Crestor back to 20 mg daily.

## 2010-11-27 NOTE — Assessment & Plan Note (Signed)
Currently with no symptoms of angina. No further workup at this time. Continue current medication regimen. He has atypical type back pain, atypical shortness of breath. No change in frequency of his symptoms and they have been ongoing for several years since the bypass surgery.

## 2010-11-27 NOTE — Assessment & Plan Note (Signed)
Blood pressure is well controlled on today's visit. No changes made to the medications. 

## 2010-12-02 ENCOUNTER — Ambulatory Visit: Payer: Medicare Other | Admitting: *Deleted

## 2010-12-02 ENCOUNTER — Other Ambulatory Visit: Payer: Medicare Other | Admitting: *Deleted

## 2010-12-02 ENCOUNTER — Ambulatory Visit (INDEPENDENT_AMBULATORY_CARE_PROVIDER_SITE_OTHER): Payer: Medicare Other | Admitting: *Deleted

## 2010-12-02 ENCOUNTER — Other Ambulatory Visit: Payer: Self-pay

## 2010-12-02 DIAGNOSIS — E785 Hyperlipidemia, unspecified: Secondary | ICD-10-CM

## 2010-12-02 LAB — CBC
HCT: 32.2 — ABNORMAL LOW
HCT: 30.6 — ABNORMAL LOW
HCT: 31.5 — ABNORMAL LOW
HCT: 33.7 — ABNORMAL LOW
HCT: 33.9 — ABNORMAL LOW
HCT: 37.1 — ABNORMAL LOW
HCT: 37.4 — ABNORMAL LOW
HCT: 38.5 — ABNORMAL LOW
HCT: 38.9 — ABNORMAL LOW
HCT: 40.4
HCT: 42.7
Hemoglobin: 11 — ABNORMAL LOW
Hemoglobin: 10.4 — ABNORMAL LOW
Hemoglobin: 10.9 — ABNORMAL LOW
Hemoglobin: 11.3 — ABNORMAL LOW
Hemoglobin: 11.3 — ABNORMAL LOW
Hemoglobin: 12.8 — ABNORMAL LOW
Hemoglobin: 12.8 — ABNORMAL LOW
Hemoglobin: 13
Hemoglobin: 13.1
Hemoglobin: 13.8
MCHC: 34.1
MCHC: 33.4
MCHC: 33.5
MCHC: 33.6
MCHC: 33.8
MCHC: 33.9
MCHC: 33.9
MCHC: 34.1
MCHC: 34.2
MCHC: 34.5
MCHC: 34.7
MCV: 93.6
MCV: 92
MCV: 92.6
MCV: 92.9
MCV: 93.7
MCV: 94
MCV: 94.4
MCV: 94.5
MCV: 94.7
MCV: 95
MCV: 95
Platelets: 172
Platelets: 115 — ABNORMAL LOW
Platelets: 119 — ABNORMAL LOW
Platelets: 121 — ABNORMAL LOW
Platelets: 124 — ABNORMAL LOW
Platelets: 126 — ABNORMAL LOW
Platelets: 148 — ABNORMAL LOW
Platelets: 200
Platelets: 205
Platelets: 211
Platelets: 226
RBC: 3.44 — ABNORMAL LOW
RBC: 3.24 — ABNORMAL LOW
RBC: 3.4 — ABNORMAL LOW
RBC: 3.55 — ABNORMAL LOW
RBC: 3.57 — ABNORMAL LOW
RBC: 4.03 — ABNORMAL LOW
RBC: 4.03 — ABNORMAL LOW
RBC: 4.07 — ABNORMAL LOW
RBC: 4.14 — ABNORMAL LOW
RBC: 4.27
RDW: 13.8
RDW: 13.2
RDW: 13.2
RDW: 13.5
RDW: 13.5
RDW: 13.6
RDW: 13.6
RDW: 13.6
RDW: 13.6
RDW: 13.7
RDW: 14
WBC: 12.6 — ABNORMAL HIGH
WBC: 10.1
WBC: 12.5 — ABNORMAL HIGH
WBC: 13.3 — ABNORMAL HIGH
WBC: 14.1 — ABNORMAL HIGH
WBC: 15.6 — ABNORMAL HIGH
WBC: 16.9 — ABNORMAL HIGH
WBC: 17.5 — ABNORMAL HIGH
WBC: 8.6
WBC: 9

## 2010-12-02 LAB — CROSSMATCH
ABO/RH(D): O POS
Antibody Screen: NEGATIVE

## 2010-12-02 LAB — BLOOD GAS, ARTERIAL
Acid-Base Excess: 0.1
Bicarbonate: 24.2 — ABNORMAL HIGH
FIO2: 0.21
O2 Saturation: 95.9
Patient temperature: 97.8
TCO2: 25.4
pCO2 arterial: 38.7
pH, Arterial: 7.411
pO2, Arterial: 77.2 — ABNORMAL LOW

## 2010-12-02 LAB — URINALYSIS, ROUTINE W REFLEX MICROSCOPIC
Bilirubin Urine: NEGATIVE
Glucose, UA: NEGATIVE
Hgb urine dipstick: NEGATIVE
Ketones, ur: NEGATIVE
Nitrite: NEGATIVE
Protein, ur: NEGATIVE
Specific Gravity, Urine: 1.02
Urobilinogen, UA: 0.2
pH: 5.5

## 2010-12-02 LAB — POCT I-STAT 3, ART BLOOD GAS (G3+)
Acid-base deficit: 1
Bicarbonate: 23.4
Bicarbonate: 24.3 — ABNORMAL HIGH
Bicarbonate: 24.7 — ABNORMAL HIGH
Operator id: 121471
Operator id: 274841
Operator id: 3291
Operator id: 3291
Patient temperature: 35.7
Patient temperature: 37.5
TCO2: 26
pCO2 arterial: 38.6
pH, Arterial: 7.372
pH, Arterial: 7.384
pH, Arterial: 7.393
pO2, Arterial: 244 — ABNORMAL HIGH
pO2, Arterial: 272 — ABNORMAL HIGH

## 2010-12-02 LAB — I-STAT EC8
Acid-base deficit: 4 — ABNORMAL HIGH
Glucose, Bld: 162 — ABNORMAL HIGH
TCO2: 22
pCO2 arterial: 37.6
pH, Arterial: 7.361

## 2010-12-02 LAB — CREATININE, SERUM
Creatinine, Ser: 0.84
Creatinine, Ser: 0.88
GFR calc Af Amer: >60
GFR calc Af Amer: >60
GFR calc non Af Amer: >60
GFR calc non Af Amer: >60

## 2010-12-02 LAB — I-STAT 8, (EC8 V) (CONVERTED LAB)
BUN: 10
Glucose, Bld: 146 — ABNORMAL HIGH
Hemoglobin: 11.9 — ABNORMAL LOW
Potassium: 4.1
Sodium: 138
TCO2: 28
pH, Ven: 7.348 — ABNORMAL HIGH

## 2010-12-02 LAB — POCT I-STAT 4, (NA,K, GLUC, HGB,HCT)
Glucose, Bld: 104 — ABNORMAL HIGH
Glucose, Bld: 112 — ABNORMAL HIGH
Glucose, Bld: 78
HCT: 27 — ABNORMAL LOW
HCT: 29 — ABNORMAL LOW
HCT: 30 — ABNORMAL LOW
HCT: 39
HCT: 42
Hemoglobin: 10.2 — ABNORMAL LOW
Hemoglobin: 13.3
Hemoglobin: 14.3
Hemoglobin: 9.9 — ABNORMAL LOW
Operator id: 3291
Operator id: 3291
Operator id: 3291
Potassium: 3.9
Potassium: 4.2
Potassium: 4.3
Potassium: 4.4
Sodium: 133 — ABNORMAL LOW
Sodium: 133 — ABNORMAL LOW
Sodium: 135
Sodium: 135
Sodium: 137
Sodium: 139

## 2010-12-02 LAB — BASIC METABOLIC PANEL
BUN: 15
BUN: 11
BUN: 14
BUN: 7
BUN: 9
CO2: 27
CO2: 26
CO2: 27
CO2: 30
Calcium: 8.8
Calcium: 8.1 — ABNORMAL LOW
Calcium: 8.6
Calcium: 8.7
Chloride: 102
Chloride: 102
Chloride: 104
Chloride: 107
Chloride: 107
Creatinine, Ser: 0.87
Creatinine, Ser: 0.73
Creatinine, Ser: 0.74
Creatinine, Ser: 0.8
Creatinine, Ser: 0.85
GFR calc Af Amer: >60
GFR calc Af Amer: >60
GFR calc Af Amer: >60
GFR calc Af Amer: >60
GFR calc Af Amer: >60
GFR calc non Af Amer: >60
GFR calc non Af Amer: >60
GFR calc non Af Amer: >60
GFR calc non Af Amer: >60
GFR calc non Af Amer: >60
Glucose, Bld: 130 — ABNORMAL HIGH
Glucose, Bld: 110 — ABNORMAL HIGH
Glucose, Bld: 112 — ABNORMAL HIGH
Glucose, Bld: 114 — ABNORMAL HIGH
Potassium: 4.5
Potassium: 3.4 — ABNORMAL LOW
Potassium: 3.6
Potassium: 4
Potassium: 4.3
Sodium: 135
Sodium: 136
Sodium: 138
Sodium: 139

## 2010-12-02 LAB — CARDIAC PANEL(CRET KIN+CKTOT+MB+TROPI)
Relative Index: 4.7 — ABNORMAL HIGH
Total CK: 113
Troponin I: 1.17

## 2010-12-02 LAB — POCT I-STAT 3, VENOUS BLOOD GAS (G3P V)
O2 Saturation: 76
TCO2: 27
pCO2, Ven: 51.3 — ABNORMAL HIGH
pO2, Ven: 45

## 2010-12-02 LAB — TSH: TSH: 1.125

## 2010-12-02 LAB — COMPREHENSIVE METABOLIC PANEL
AST: 28
Albumin: 3.6
BUN: 10
Calcium: 8.7
Creatinine, Ser: 0.79
GFR calc Af Amer: >60

## 2010-12-02 LAB — PROTIME-INR
INR: 1
INR: 1.3
Prothrombin Time: 17 — ABNORMAL HIGH

## 2010-12-02 LAB — HEPARIN LEVEL (UNFRACTIONATED)
Heparin Unfractionated: 0.26 — ABNORMAL LOW
Heparin Unfractionated: 0.44
Heparin Unfractionated: 0.49

## 2010-12-02 LAB — PLATELET COUNT: Platelets: 126 — ABNORMAL LOW

## 2010-12-02 LAB — MAGNESIUM
Magnesium: 2.3
Magnesium: 2.6 — ABNORMAL HIGH
Magnesium: 2.7 — ABNORMAL HIGH

## 2010-12-02 LAB — HEMOGLOBIN A1C: Hgb A1c MFr Bld: 6.3 — ABNORMAL HIGH

## 2010-12-02 LAB — APTT: aPTT: 32

## 2010-12-02 NOTE — Progress Notes (Signed)
Addended by: Festus Aloe on: 12/02/2010 08:49 AM   Modules accepted: Orders

## 2010-12-03 DIAGNOSIS — N529 Male erectile dysfunction, unspecified: Secondary | ICD-10-CM | POA: Insufficient documentation

## 2010-12-03 LAB — HEPATIC FUNCTION PANEL
AST: 20 IU/L (ref 0–40)
Albumin: 4.3 g/dL (ref 3.5–5.5)
Alkaline Phosphatase: 68 IU/L (ref 25–150)
Bilirubin, Direct: 0.12 mg/dL (ref 0.00–0.40)

## 2010-12-03 LAB — LIPID PANEL: Triglycerides: 123 mg/dL (ref 0–149)

## 2010-12-03 NOTE — Assessment & Plan Note (Signed)
He reports that he has had significant symptoms of erectile dysfunction recently. He has tried other medications such as Cialis and Viagra with no significant improvement. He is requesting that we prescribe a vacuum erection device. We will place this request for him. 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

## 2010-12-04 ENCOUNTER — Encounter: Payer: Self-pay | Admitting: Cardiovascular Disease

## 2010-12-30 ENCOUNTER — Other Ambulatory Visit: Payer: Self-pay | Admitting: Cardiology

## 2010-12-30 DIAGNOSIS — I739 Peripheral vascular disease, unspecified: Secondary | ICD-10-CM

## 2010-12-31 ENCOUNTER — Encounter (INDEPENDENT_AMBULATORY_CARE_PROVIDER_SITE_OTHER): Payer: Medicare Other | Admitting: *Deleted

## 2010-12-31 DIAGNOSIS — I739 Peripheral vascular disease, unspecified: Secondary | ICD-10-CM

## 2011-01-02 ENCOUNTER — Encounter: Payer: Medicare Other | Admitting: *Deleted

## 2011-01-04 ENCOUNTER — Other Ambulatory Visit: Payer: Self-pay | Admitting: Cardiovascular Disease

## 2011-01-06 ENCOUNTER — Telehealth: Payer: Self-pay

## 2011-01-06 MED ORDER — ROSUVASTATIN CALCIUM 20 MG PO TABS: 20.0000 mg | ORAL_TABLET | Freq: Every day | ORAL | Status: DC

## 2011-01-06 NOTE — Telephone Encounter (Signed)
Refill sent for crestor 20 mg take one tablet daily.  

## 2011-02-06 ENCOUNTER — Ambulatory Visit (INDEPENDENT_AMBULATORY_CARE_PROVIDER_SITE_OTHER): Payer: Medicare Other | Admitting: *Deleted

## 2011-02-06 ENCOUNTER — Other Ambulatory Visit: Payer: Self-pay | Admitting: Internal Medicine

## 2011-02-06 ENCOUNTER — Encounter: Payer: Self-pay | Admitting: Internal Medicine

## 2011-02-06 DIAGNOSIS — I5022 Chronic systolic (congestive) heart failure: Secondary | ICD-10-CM

## 2011-02-06 DIAGNOSIS — I2589 Other forms of chronic ischemic heart disease: Secondary | ICD-10-CM

## 2011-02-14 LAB — REMOTE ICD DEVICE
AL IMPEDENCE ICD: 544 Ohm
CHARGE TIME: 10.27 s
DEV-0020ICD: NEGATIVE
PACEART VT: 0
TOT-0001: 2
TOT-0002: 0
TOT-0006: 20090326000000
TZAT-0001ATACH: 1
TZAT-0001FASTVT: 1
TZAT-0001SLOWVT: 1
TZAT-0002ATACH: NEGATIVE
TZAT-0002ATACH: NEGATIVE
TZAT-0012ATACH: 150 ms
TZAT-0012FASTVT: 200 ms
TZAT-0012SLOWVT: 200 ms
TZAT-0018ATACH: NEGATIVE
TZAT-0018FASTVT: NEGATIVE
TZAT-0019ATACH: 6 V
TZAT-0019ATACH: 6 V
TZAT-0019SLOWVT: 8 V
TZAT-0020ATACH: 1.5 ms
TZAT-0020SLOWVT: 1.5 ms
TZST-0001ATACH: 5
TZST-0001FASTVT: 4
TZST-0001FASTVT: 5
TZST-0001SLOWVT: 2
TZST-0001SLOWVT: 4
TZST-0001SLOWVT: 6
TZST-0002ATACH: NEGATIVE
TZST-0002FASTVT: NEGATIVE
TZST-0002FASTVT: NEGATIVE
TZST-0002FASTVT: NEGATIVE
TZST-0003SLOWVT: 15 J
TZST-0003SLOWVT: 35 J
VENTRICULAR PACING ICD: 7.1 pct

## 2011-02-14 NOTE — Progress Notes (Signed)
ICD remote with ICM 

## 2011-02-20 LAB — CBC
HGB: 14.9 g/dL
PLATELETS: 223
WBC: 6.9

## 2011-03-06 ENCOUNTER — Encounter: Payer: Self-pay | Admitting: *Deleted

## 2011-03-15 ENCOUNTER — Other Ambulatory Visit: Payer: Self-pay | Admitting: Cardiovascular Disease

## 2011-03-17 ENCOUNTER — Telehealth: Payer: Self-pay

## 2011-03-17 ENCOUNTER — Other Ambulatory Visit: Payer: Self-pay | Admitting: Cardiovascular Disease

## 2011-03-17 MED ORDER — CLOPIDOGREL BISULFATE 75 MG PO TABS: 75.0000 mg | ORAL_TABLET | Freq: Every day | ORAL | Status: DC

## 2011-03-17 NOTE — Telephone Encounter (Signed)
Refill sent for plavix  

## 2011-03-21 ENCOUNTER — Telehealth: Payer: Self-pay

## 2011-03-21 MED ORDER — CLOPIDOGREL BISULFATE 75 MG PO TABS: 75.0000 mg | ORAL_TABLET | Freq: Every day | ORAL | Status: DC

## 2011-03-21 NOTE — Telephone Encounter (Signed)
Refill sent for plavix  

## 2011-04-07 ENCOUNTER — Other Ambulatory Visit: Payer: Self-pay | Admitting: Cardiovascular Disease

## 2011-05-15 ENCOUNTER — Encounter: Payer: Self-pay | Admitting: Internal Medicine

## 2011-05-15 ENCOUNTER — Ambulatory Visit (INDEPENDENT_AMBULATORY_CARE_PROVIDER_SITE_OTHER): Payer: Medicare Other | Admitting: *Deleted

## 2011-05-15 DIAGNOSIS — I5022 Chronic systolic (congestive) heart failure: Secondary | ICD-10-CM

## 2011-05-15 DIAGNOSIS — I428 Other cardiomyopathies: Secondary | ICD-10-CM

## 2011-05-20 LAB — REMOTE ICD DEVICE
AL AMPLITUDE: 2.6 mv
AL IMPEDENCE ICD: 472 Ohm
ATRIAL PACING ICD: 36.82 pct
BAMS-0001: 170 {beats}/min
BATTERY VOLTAGE: 3.04 v
CHARGE TIME: 10.46 s
DEV-0020ICD: NEGATIVE
FVT: 0
PACEART VT: 0
RV LEAD AMPLITUDE: 16.9 mv
RV LEAD IMPEDENCE ICD: 560 Ohm
TOT-0001: 2
TOT-0002: 0
TOT-0006: 20090326000000
TZAT-0001ATACH: 1
TZAT-0001ATACH: 2
TZAT-0001ATACH: 3
TZAT-0001FASTVT: 1
TZAT-0001SLOWVT: 1
TZAT-0002ATACH: NEGATIVE
TZAT-0002ATACH: NEGATIVE
TZAT-0002ATACH: NEGATIVE
TZAT-0002FASTVT: NEGATIVE
TZAT-0004SLOWVT: 10
TZAT-0005SLOWVT: 84 pct
TZAT-0011SLOWVT: 10 ms
TZAT-0012ATACH: 150 ms
TZAT-0012ATACH: 150 ms
TZAT-0012ATACH: 150 ms
TZAT-0012FASTVT: 200 ms
TZAT-0012SLOWVT: 200 ms
TZAT-0013SLOWVT: 3
TZAT-0018ATACH: NEGATIVE
TZAT-0018ATACH: NEGATIVE
TZAT-0018ATACH: NEGATIVE
TZAT-0018FASTVT: NEGATIVE
TZAT-0018SLOWVT: NEGATIVE
TZAT-0019ATACH: 6 v
TZAT-0019ATACH: 6 v
TZAT-0019ATACH: 6 v
TZAT-0019FASTVT: 8 v
TZAT-0019SLOWVT: 8 v
TZAT-0020ATACH: 1.5 ms
TZAT-0020ATACH: 1.5 ms
TZAT-0020ATACH: 1.5 ms
TZAT-0020FASTVT: 1.5 ms
TZAT-0020SLOWVT: 1.5 ms
TZON-0003ATACH: 350 ms
TZON-0003SLOWVT: 330 ms
TZON-0003VSLOWVT: 450 ms
TZON-0004SLOWVT: 24
TZON-0004VSLOWVT: 32
TZON-0005SLOWVT: 12
TZST-0001ATACH: 4
TZST-0001ATACH: 5
TZST-0001ATACH: 6
TZST-0001FASTVT: 2
TZST-0001FASTVT: 3
TZST-0001FASTVT: 4
TZST-0001FASTVT: 5
TZST-0001FASTVT: 6
TZST-0001SLOWVT: 2
TZST-0001SLOWVT: 3
TZST-0001SLOWVT: 4
TZST-0001SLOWVT: 5
TZST-0001SLOWVT: 6
TZST-0002ATACH: NEGATIVE
TZST-0002ATACH: NEGATIVE
TZST-0002ATACH: NEGATIVE
TZST-0002FASTVT: NEGATIVE
TZST-0002FASTVT: NEGATIVE
TZST-0002FASTVT: NEGATIVE
TZST-0002FASTVT: NEGATIVE
TZST-0002FASTVT: NEGATIVE
TZST-0003SLOWVT: 15 J
TZST-0003SLOWVT: 25 J
TZST-0003SLOWVT: 35 J
TZST-0003SLOWVT: 35 J
TZST-0003SLOWVT: 35 J
VENTRICULAR PACING ICD: 4.67 pct
VF: 0

## 2011-05-21 NOTE — Progress Notes (Signed)
Remote icd check w/icm  

## 2011-05-30 ENCOUNTER — Encounter: Payer: Self-pay | Admitting: *Deleted

## 2011-06-02 ENCOUNTER — Ambulatory Visit (INDEPENDENT_AMBULATORY_CARE_PROVIDER_SITE_OTHER): Payer: Medicare Other | Admitting: Cardiovascular Disease

## 2011-06-02 ENCOUNTER — Encounter: Payer: Self-pay | Admitting: Cardiovascular Disease

## 2011-06-02 VITALS — BP 154/68 | HR 61 | Ht 67.0 in | Wt 223.8 lb

## 2011-06-02 DIAGNOSIS — Z95810 Presence of automatic (implantable) cardiac defibrillator: Secondary | ICD-10-CM

## 2011-06-02 DIAGNOSIS — I4949 Other premature depolarization: Secondary | ICD-10-CM

## 2011-06-02 DIAGNOSIS — I10 Essential (primary) hypertension: Secondary | ICD-10-CM

## 2011-06-02 DIAGNOSIS — E785 Hyperlipidemia, unspecified: Secondary | ICD-10-CM

## 2011-06-02 DIAGNOSIS — I5022 Chronic systolic (congestive) heart failure: Secondary | ICD-10-CM

## 2011-06-02 DIAGNOSIS — I25810 Atherosclerosis of coronary artery bypass graft(s) without angina pectoris: Secondary | ICD-10-CM

## 2011-06-02 MED ORDER — ISOSORBIDE MONONITRATE ER 30 MG PO TB24: 60.0000 mg | ORAL_TABLET | Freq: Every day | ORAL | Status: DC

## 2011-06-02 NOTE — Assessment & Plan Note (Signed)
Followed by Dr. Klein 

## 2011-06-02 NOTE — Assessment & Plan Note (Addendum)
Cholesterol adequate. Weight is climbing. We have asked him to lose several pounds. We'll repeat a lipid panel today at his request.

## 2011-06-02 NOTE — Assessment & Plan Note (Signed)
Currently with no symptoms of angina. No further workup at this time. Continue current medication regimen. 

## 2011-06-02 NOTE — Assessment & Plan Note (Signed)
We have asked him to monitor his blood pressure at home and call our office for systolic pressures greater than 140.

## 2011-06-02 NOTE — Patient Instructions (Signed)
You are doing well. No medication changes were made.  Please call us if you have new issues that need to be addressed before your next appt.  Your physician wants you to follow-up in: 6 months.  You will receive a reminder letter in the mail two months in advance. If you don't receive a letter, please call our office to schedule the follow-up appointment.   

## 2011-06-02 NOTE — Progress Notes (Signed)
Patient ID: Paul Moore, male    DOB: [birth year], 61 y.o.   MRN: [id number]  HPI Comments: Mr Teagle is a 61 year old gentleman with a history of smoking for 35 years, coronary artery disease, bypass x5 in July 2008, catheterization in December 2008 for recurrent chest pain with stents to his native RCA, occluded vein graft to the PDA, 60-70% proximal vein graft disease to the diagonal,  s/p ICD implantation for primary prevention, presents for routine followup.     Mr. Brakebill is doing well. His weight is up 13 pounds over the last year. He denies any chest pain. He does have mild shortness of breath and leg fatigue with exertion. Also with some joint aching. He continues to have trouble with his weight.   echocardiogram in 2009 shows ejection fraction 20-30%, mildly dilated left ventricle, moderately enlarged right ventricle with moderately decreased systolic function.   Last stress test in November 2008, treadmill where he exercised only for 3 minutes, 5 minutes. The large region of decreased perfusion in the inferior and lateral walls no ischemia, ejection fraction 22%.   Last catheterization December 2008 with a PTCA and stenting of the RCA, Promus stent 3.0 x 28 mm. 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 aspirin 81 MG tablet Take 1 tablet (81 mg total) by mouth daily.  30 tablet  0  . clopidogrel (PLAVIX) 75 MG tablet Take 1 tablet (75 mg total) by mouth daily.  30 tablet  6  . furosemide (LASIX) 40 MG tablet Take 1 tablet (40 mg total) by mouth daily.   90 tablet  3  . isosorbide mononitrate (IMDUR) 30 MG 24 hr tablet Take 2 tablets (60 mg total) by mouth daily. Take 2 tablets at bed time  60 tablet  6  . KLOR-CON M20 20 MEQ tablet TAKE ONE TABLET BY MOUTH EVERY DAY  30 each  3  . lisinopril-hydrochlorothiazide (PRINZIDE,ZESTORETIC) 20-12.5 MG per tablet Take 1 tablet by mouth daily.  90 tablet  3  . rosuvastatin (CRESTOR) 20 MG tablet Take 1 tablet (20 mg total) by mouth daily.  30 tablet  6     Review of Systems  Constitutional: Negative.   HENT: Negative.   Eyes: Negative.   Respiratory: Positive for shortness of breath.        Occasional episodes of brief shortness of breath at rest  Cardiovascular: Negative.   Gastrointestinal: Negative.   Skin: Negative.   Neurological: Negative.   Hematological: Negative.   Psychiatric/Behavioral: Negative.   All other systems reviewed and are negative.    BP 154/68  Ht 5\' 7"  (1.702 m)  Wt 223 lb 12.8 oz (101.515 kg)  BMI 35.05 kg/m2 140/80 on recheck.  Physical Exam  Nursing note and vitals reviewed. Constitutional: He is oriented to person, place, and time. He appears well-developed and well-nourished.       obese  HENT:  Head: Normocephalic.  Nose: Nose normal.  Mouth/Throat: Oropharynx is clear and moist.  Eyes: Conjunctivae are normal. Pupils are equal, round, and reactive  to light.  Neck: Normal range of motion. Neck supple. No JVD present.  Cardiovascular: Normal rate, regular rhythm, S1 normal, S2 normal, normal heart sounds and intact distal pulses.  Exam reveals no gallop and no friction rub.   No murmur heard. Pulmonary/Chest: Effort normal and breath sounds normal. No respiratory distress. He has no wheezes. He has no rales. He exhibits no tenderness.  Abdominal: Soft. Bowel sounds are normal. He exhibits no distension. There is no tenderness.  Musculoskeletal: Normal range of motion. He exhibits no edema and no tenderness.  Lymphadenopathy:    He has no cervical  adenopathy.  Neurological: He is alert and oriented to person, place, and time. Coordination normal.  Skin: Skin is warm and dry. No rash noted. No erythema.  Psychiatric: He has a normal mood and affect. His behavior is normal. Judgment and thought content normal.           Assessment and Plan

## 2011-06-02 NOTE — Assessment & Plan Note (Signed)
Appears euvolemic on clinical exam today. No further medication changes. He has had problems in the past on beta blockers with significant fatigue.

## 2011-06-03 LAB — LIPID PANEL
Cholesterol, Total: 140 mg/dL (ref 100–199)
HDL: 43 mg/dL (ref >39)
LDL Calculated: 77 mg/dL (ref 0–99)
Triglycerides: 98 mg/dL (ref 0–149)
VLDL Cholesterol Cal: 20 mg/dL (ref 5–40)

## 2011-06-04 ENCOUNTER — Encounter: Payer: Self-pay | Admitting: *Deleted

## 2011-07-23 ENCOUNTER — Other Ambulatory Visit: Payer: Self-pay | Admitting: Cardiovascular Disease

## 2011-07-24 ENCOUNTER — Telehealth: Payer: Self-pay | Admitting: Cardiovascular Disease

## 2011-07-24 NOTE — Telephone Encounter (Signed)
Pt calling wants to speak to nurse about medication. Wouldn't tell me any more information

## 2011-07-24 NOTE — Telephone Encounter (Signed)
Pt needs refill sent in of lisinopril/hctz. i explained I sent this in already this am.  Understanding verb

## 2011-08-17 ENCOUNTER — Other Ambulatory Visit: Payer: Self-pay | Admitting: Cardiovascular Disease

## 2011-08-18 ENCOUNTER — Other Ambulatory Visit: Payer: Self-pay | Admitting: *Deleted

## 2011-08-18 MED ORDER — FUROSEMIDE 40 MG PO TABS: 40.0000 mg | ORAL_TABLET | Freq: Every day | ORAL | Status: DC

## 2011-08-18 NOTE — Telephone Encounter (Signed)
Refilled Furosemide. 

## 2011-08-22 ENCOUNTER — Ambulatory Visit (INDEPENDENT_AMBULATORY_CARE_PROVIDER_SITE_OTHER): Payer: Medicare Other | Admitting: *Deleted

## 2011-08-22 ENCOUNTER — Encounter: Payer: Self-pay | Admitting: Internal Medicine

## 2011-08-22 DIAGNOSIS — Z95810 Presence of automatic (implantable) cardiac defibrillator: Secondary | ICD-10-CM

## 2011-08-22 DIAGNOSIS — I5022 Chronic systolic (congestive) heart failure: Secondary | ICD-10-CM

## 2011-08-22 LAB — REMOTE ICD DEVICE
AL IMPEDENCE ICD: 432 Ohm
ATRIAL PACING ICD: 31.59 pct
CHARGE TIME: 10.46 s
RV LEAD IMPEDENCE ICD: 528 Ohm
TOT-0001: 2
TOT-0006: 20090326000000
TZAT-0001ATACH: 1
TZAT-0001ATACH: 2
TZAT-0001ATACH: 3
TZAT-0001FASTVT: 1
TZAT-0001SLOWVT: 1
TZAT-0002ATACH: NEGATIVE
TZAT-0002ATACH: NEGATIVE
TZAT-0002ATACH: NEGATIVE
TZAT-0002FASTVT: NEGATIVE
TZAT-0012ATACH: 150 ms
TZAT-0013SLOWVT: 3
TZAT-0018ATACH: NEGATIVE
TZAT-0018ATACH: NEGATIVE
TZAT-0018FASTVT: NEGATIVE
TZAT-0018SLOWVT: NEGATIVE
TZAT-0019ATACH: 6 V
TZAT-0019ATACH: 6 V
TZAT-0020SLOWVT: 1.5 ms
TZON-0004SLOWVT: 24
TZON-0004VSLOWVT: 32
TZON-0005SLOWVT: 12
TZST-0001ATACH: 5
TZST-0001ATACH: 6
TZST-0001FASTVT: 3
TZST-0001FASTVT: 4
TZST-0001FASTVT: 5
TZST-0001FASTVT: 6
TZST-0001SLOWVT: 2
TZST-0001SLOWVT: 4
TZST-0001SLOWVT: 5
TZST-0002ATACH: NEGATIVE
TZST-0002ATACH: NEGATIVE
TZST-0002FASTVT: NEGATIVE
TZST-0002FASTVT: NEGATIVE
TZST-0003SLOWVT: 25 J
TZST-0003SLOWVT: 35 J
VENTRICULAR PACING ICD: 3.08 pct
VF: 0

## 2011-09-02 ENCOUNTER — Other Ambulatory Visit: Payer: Self-pay | Admitting: Cardiovascular Disease

## 2011-09-03 ENCOUNTER — Other Ambulatory Visit: Payer: Self-pay | Admitting: *Deleted

## 2011-09-03 MED ORDER — POTASSIUM CHLORIDE CRYS ER 20 MEQ PO TBCR: 20.0000 meq | EXTENDED_RELEASE_TABLET | Freq: Every day | ORAL | Status: DC

## 2011-09-03 NOTE — Telephone Encounter (Signed)
Refilled Klor-Con 

## 2011-09-12 ENCOUNTER — Encounter: Payer: Self-pay | Admitting: *Deleted

## 2011-09-28 ENCOUNTER — Other Ambulatory Visit: Payer: Self-pay | Admitting: Cardiovascular Disease

## 2011-09-29 ENCOUNTER — Other Ambulatory Visit: Payer: Self-pay | Admitting: *Deleted

## 2011-09-29 MED ORDER — ROSUVASTATIN CALCIUM 20 MG PO TABS: 20.0000 mg | ORAL_TABLET | Freq: Every day | ORAL | Status: DC

## 2011-09-29 NOTE — Telephone Encounter (Signed)
Refilled Crestor. 

## 2011-10-30 ENCOUNTER — Encounter: Payer: Self-pay | Admitting: Internal Medicine

## 2011-10-30 ENCOUNTER — Ambulatory Visit (INDEPENDENT_AMBULATORY_CARE_PROVIDER_SITE_OTHER): Payer: Medicare Other | Admitting: Internal Medicine

## 2011-10-30 VITALS — BP 142/90 | HR 72 | Ht 67.0 in | Wt 219.5 lb

## 2011-10-30 DIAGNOSIS — I5022 Chronic systolic (congestive) heart failure: Secondary | ICD-10-CM

## 2011-10-30 DIAGNOSIS — Z95810 Presence of automatic (implantable) cardiac defibrillator: Secondary | ICD-10-CM

## 2011-10-30 DIAGNOSIS — I2589 Other forms of chronic ischemic heart disease: Secondary | ICD-10-CM

## 2011-10-30 DIAGNOSIS — I10 Essential (primary) hypertension: Secondary | ICD-10-CM

## 2011-10-30 LAB — ICD DEVICE OBSERVATION
AL AMPLITUDE: 3.3634 mv
AL THRESHOLD: 0.5 V
BAMS-0001: 170 {beats}/min
BATTERY VOLTAGE: 3.07 V
FVT: 0
RV LEAD AMPLITUDE: 16.5894 mv
RV LEAD IMPEDENCE ICD: 520 Ohm
RV LEAD THRESHOLD: 1 V
TZAT-0001ATACH: 1
TZAT-0001FASTVT: 1
TZAT-0002ATACH: NEGATIVE
TZAT-0002ATACH: NEGATIVE
TZAT-0004SLOWVT: 10
TZAT-0012FASTVT: 200 ms
TZAT-0012SLOWVT: 200 ms
TZAT-0018ATACH: NEGATIVE
TZAT-0018ATACH: NEGATIVE
TZAT-0018FASTVT: NEGATIVE
TZAT-0019ATACH: 6 V
TZAT-0019ATACH: 6 V
TZAT-0020ATACH: 1.5 ms
TZAT-0020ATACH: 1.5 ms
TZAT-0020FASTVT: 1.5 ms
TZAT-0020SLOWVT: 1.5 ms
TZON-0003SLOWVT: 330 ms
TZST-0001ATACH: 5
TZST-0001FASTVT: 2
TZST-0001FASTVT: 4
TZST-0001FASTVT: 6
TZST-0001SLOWVT: 2
TZST-0001SLOWVT: 4
TZST-0002ATACH: NEGATIVE
TZST-0002ATACH: NEGATIVE
TZST-0002FASTVT: NEGATIVE
TZST-0003SLOWVT: 15 J
TZST-0003SLOWVT: 35 J
TZST-0003SLOWVT: 35 J
VF: 0

## 2011-10-30 NOTE — Assessment & Plan Note (Addendum)
Stable on currrent meds; as best as I can tell he is not on beta blockers secondary to dizziness. The only one that I see any epic MAR this Bystolic.  I will defer to Dr. Knute Neu efforts to try different beta blockers

## 2011-10-30 NOTE — Assessment & Plan Note (Signed)
The patient's device was interrogated.  The information was reviewed. No changes were made in the programming.    

## 2011-10-30 NOTE — Assessment & Plan Note (Signed)
Stable on current regime 

## 2011-10-30 NOTE — Patient Instructions (Addendum)
Your physician wants you to follow-up in: 1 year with Dr Klein.  You will receive a reminder letter in the mail two months in advance. If you don't receive a letter, please call our office to schedule the follow-up appointment.  

## 2011-10-30 NOTE — Progress Notes (Signed)
Patient Care Team: Gwenlyn Found as PCP - General   HPI  Paul Moore is a 61 y.o. male Seen in followup for ICD implanted for primary prevention in the setting of coronary artery disease, bypass x5 in July 2009     The patient denies chest pain, shortness of breath, nocturnal dyspnea, orthopnea or peripheral edema.  There have been no palpitations, lightheadedness or syncope.   Blood pressure is elevated today but he blames it on having seen his older sister on the way over  echocardiogram in 2011 demonstrated LV dysfunction EF 30-35% with inferolateral septal and apical wall motion abnormalities.   Last stress test in November 2008, treadmill where he exercised only for 3 minutes, 5 minutes. The large region of decreased perfusion in the inferior and lateral walls no ischemia, ejection fraction 22%.  Last catheterization December 2008 with a PTCA and stenting of the RCA, Promus stent 3.0 x 28 mm. occluded vein graft to the RCA at the ostium, severe diffuse native RCA disease with 90% proximal, 90% mid, high-grade PDA narrowing in a small vessel, moderate stenosis at the ostium of the vein graft to the diagonal, patent LIMA and patent vein graft to the circumflex  Last LDL was 77 in April.   Past Medical History  Diagnosis Date  . Hypertension   . PVD (peripheral vascular disease)   . Ischemic cardiomyopathy   . Dyslipidemia   . Coronary artery disease     Past Surgical History  Procedure Date  . Coronary artery bypass graft   . Incise and drain abcess     perirectal  . Iliac artery stent     right  . Cardiac defibrillator placement     Current Outpatient Prescriptions  Medication Sig Dispense Refill  . aspirin 81 MG tablet Take 1 tablet (81 mg total) by mouth daily.  30 tablet  0  . clopidogrel (PLAVIX) 75 MG tablet Take 1 tablet (75 mg total) by mouth daily.  30 tablet  6  . furosemide (LASIX) 40 MG tablet Take 1 tablet (40 mg total) by mouth daily.  90 tablet  3  .  isosorbide mononitrate (IMDUR) 30 MG 24 hr tablet Take 2 tablets (60 mg total) by mouth daily. Take 2 tablets at bed time  60 tablet  6  . lisinopril-hydrochlorothiazide (PRINZIDE,ZESTORETIC) 20-12.5 MG per tablet TAKE ONE TABLET BY MOUTH EVERY DAY  90 tablet  3  . potassium chloride SA (KLOR-CON M20) 20 MEQ tablet Take 1 tablet (20 mEq total) by mouth daily.  30 tablet  5  . rosuvastatin (CRESTOR) 20 MG tablet Take 1 tablet (20 mg total) by mouth daily.  30 tablet  6    Allergies  Allergen Reactions  . Amiodarone     REACTION: Hives    Review of Systems negative except from HPI and PMH  Physical Exam BP 142/90  Pulse 72  Ht 5\' 7"  (1.702 m)  Wt 219 lb 8 oz (99.565 kg)  BMI 34.38 kg/m2 Well developed and well nourished in no acute distress HENT normal E scleral and icterus clear Neck Supple JVP flat; carotids brisk and full Clear to ausculation Regular rate and rhythm, no murmurs gallops or rub Soft with active bowel sounds No clubbing cyanosis none of this Edema Alert and oriented, grossly normal motor and sensory function Skin Warm and Dry    Assessment and  Plan

## 2011-10-30 NOTE — Assessment & Plan Note (Signed)
Elevated today; he thinks the situation. He will follow at home

## 2011-12-01 ENCOUNTER — Encounter: Payer: Self-pay | Admitting: Cardiovascular Disease

## 2011-12-01 ENCOUNTER — Ambulatory Visit (INDEPENDENT_AMBULATORY_CARE_PROVIDER_SITE_OTHER): Payer: Medicare Other | Admitting: Cardiovascular Disease

## 2011-12-01 VITALS — BP 122/84 | HR 63 | Ht 67.0 in | Wt 222.8 lb

## 2011-12-01 DIAGNOSIS — I2589 Other forms of chronic ischemic heart disease: Secondary | ICD-10-CM

## 2011-12-01 DIAGNOSIS — I4949 Other premature depolarization: Secondary | ICD-10-CM

## 2011-12-01 DIAGNOSIS — E785 Hyperlipidemia, unspecified: Secondary | ICD-10-CM

## 2011-12-01 DIAGNOSIS — I5022 Chronic systolic (congestive) heart failure: Secondary | ICD-10-CM

## 2011-12-01 DIAGNOSIS — I10 Essential (primary) hypertension: Secondary | ICD-10-CM

## 2011-12-01 NOTE — Assessment & Plan Note (Signed)
Appears euvolemic on today's visit. No changes to his medications.

## 2011-12-01 NOTE — Assessment & Plan Note (Signed)
Cholesterol is at goal on the current lipid regimen. No changes to the medications were made. We have suggested he continue to work on his weight

## 2011-12-01 NOTE — Patient Instructions (Signed)
You are doing well. No medication changes were made.  Please call us if you have new issues that need to be addressed before your next appt.  Your physician wants you to follow-up in: 6 months.  You will receive a reminder letter in the mail two months in advance. If you don't receive a letter, please call our office to schedule the follow-up appointment.   

## 2011-12-01 NOTE — Assessment & Plan Note (Signed)
Currently with no symptoms of angina. No further workup at this time. Continue current medication regimen. No signs of congestive heart failure. Appears relatively euvolemic.

## 2011-12-01 NOTE — Progress Notes (Signed)
Patient ID: Paul Moore, male    DOB: 1950-02-25, 61 y.o.   MRN: [id number]  HPI Comments: Paul Moore is a 61 year old gentleman with a history of smoking for 35 years, coronary artery disease, bypass x5 in July 2008, catheterization in December 2008 for recurrent chest pain with stents to his native RCA, occluded vein graft to the PDA, 60-70% proximal vein graft disease to the diagonal,  s/p ICD implantation for primary prevention, presents for routine followup.     Paul Moore is doing well.  He denies any chest pain. some joint aching. He continues to have trouble with his weight. He reports doing some exercises almost everyday, biking 4 miles, using his Wii fit video game. No significant chest pain concerning for angina. Prior anginal symptoms included a sharp pain in his back. He does continue to have this fairly well not on a consistent basis with exertion.  echocardiogram in 2009 shows ejection fraction 20-30%, mildly dilated left ventricle, moderately enlarged right ventricle with moderately decreased systolic function.   Last stress test in November 2008, treadmill where he exercised only for 3 minutes, 5 minutes. The large region of decreased perfusion in the inferior and lateral walls no ischemia, ejection fraction 22%.   Last catheterization December 2008 with a PTCA and stenting of the RCA, Promus stent 3.0 x 28 mm. 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 aspirin 81 MG tablet Take 1 tablet (81 mg total) by mouth daily.  30 tablet  0  . clopidogrel  (PLAVIX) 75 MG tablet Take 1 tablet (75 mg total) by mouth daily.  30 tablet  6  . furosemide (LASIX) 40 MG tablet Take 1 tablet (40 mg total) by mouth daily.  90 tablet  3  . isosorbide mononitrate (IMDUR) 30 MG 24 hr tablet Take 2 tablets (60 mg total) by mouth daily. Take 2 tablets at bed time  60 tablet  6  . lisinopril-hydrochlorothiazide (PRINZIDE,ZESTORETIC) 20-12.5 MG per tablet TAKE ONE TABLET BY MOUTH EVERY DAY  90 tablet  3  . potassium chloride SA (KLOR-CON M20) 20 MEQ tablet Take 1 tablet (20 mEq total) by mouth daily.  30 tablet  5  . rosuvastatin (CRESTOR) 20 MG tablet Take 1 tablet (20 mg total) by mouth daily.  30 tablet  6     Review of Systems  Constitutional: Negative.   HENT: Negative.   Eyes: Negative.   Respiratory: Positive for shortness of breath.        Occasional episodes of brief shortness of breath at rest  Cardiovascular: Negative.   Gastrointestinal: Negative.   Skin: Negative.   Neurological: Negative.   Hematological: Negative.   Psychiatric/Behavioral: Negative.   All other systems reviewed and are negative.    BP 122/84  Pulse 63  Ht 5\' 7"  (1.702 m)  Wt 222 lb 12 oz (101.039 kg)  BMI 34.89 kg/m2  Physical Exam  Nursing note and vitals reviewed. Constitutional: He is oriented to person, place, and time. He appears well-developed and well-nourished.  obese  HENT:  Head: Normocephalic.  Nose: Nose normal.  Mouth/Throat: Oropharynx is clear and moist.  Eyes: Conjunctivae normal are normal. Pupils are equal, round, and reactive to light.  Neck: Normal range of motion. Neck supple. No JVD present.  Cardiovascular: Normal rate, regular rhythm, S1 normal, S2 normal, normal heart sounds and intact distal pulses.  Exam reveals no gallop and no friction rub.   No murmur heard. Pulmonary/Chest: Effort normal and breath sounds normal. No respiratory distress. He has no wheezes. He has no rales. He exhibits no tenderness.  Abdominal: Soft. Bowel  sounds are normal. He exhibits no distension. There is no tenderness.  Musculoskeletal: Normal range of motion. He exhibits no edema and no tenderness.  Lymphadenopathy:    He has no cervical adenopathy.  Neurological: He is alert and oriented to person, place, and time. Coordination normal.  Skin: Skin is warm and dry. No rash noted. No erythema.  Psychiatric: He has a normal mood and affect. His behavior is normal. Judgment and thought content normal.           Assessment and Plan

## 2011-12-01 NOTE — Assessment & Plan Note (Signed)
Blood pressure is well controlled on today's visit. No changes made to the medications. 

## 2012-01-13 ENCOUNTER — Other Ambulatory Visit: Payer: Self-pay | Admitting: Cardiovascular Disease

## 2012-01-14 MED ORDER — LISINOPRIL-HYDROCHLOROTHIAZIDE 20-12.5 MG PO TABS: 1.0000 | ORAL_TABLET | Freq: Every day | ORAL | Status: DC

## 2012-01-30 ENCOUNTER — Other Ambulatory Visit: Payer: Self-pay | Admitting: *Deleted

## 2012-01-30 MED ORDER — POTASSIUM CHLORIDE CRYS ER 20 MEQ PO TBCR: 20.0000 meq | EXTENDED_RELEASE_TABLET | Freq: Every day | ORAL | Status: DC

## 2012-01-30 NOTE — Telephone Encounter (Signed)
Refilled Klor-Con 

## 2012-02-02 ENCOUNTER — Encounter: Payer: Self-pay | Admitting: Internal Medicine

## 2012-02-02 ENCOUNTER — Ambulatory Visit (INDEPENDENT_AMBULATORY_CARE_PROVIDER_SITE_OTHER): Payer: Medicare Other | Admitting: *Deleted

## 2012-02-02 DIAGNOSIS — I2589 Other forms of chronic ischemic heart disease: Secondary | ICD-10-CM

## 2012-02-02 DIAGNOSIS — Z95810 Presence of automatic (implantable) cardiac defibrillator: Secondary | ICD-10-CM

## 2012-02-02 DIAGNOSIS — I5022 Chronic systolic (congestive) heart failure: Secondary | ICD-10-CM

## 2012-02-12 ENCOUNTER — Encounter: Payer: Self-pay | Admitting: *Deleted

## 2012-02-15 LAB — REMOTE ICD DEVICE
AL IMPEDENCE ICD: 552 Ohm
ATRIAL PACING ICD: 26.97 pct
BAMS-0001: 170 {beats}/min
BATTERY VOLTAGE: 3.06 V
CHARGE TIME: 10.66 s
DEV-0020ICD: NEGATIVE
PACEART VT: 0
TOT-0001: 2
TOT-0002: 0
TOT-0006: 20090326000000
TZAT-0001ATACH: 1
TZAT-0001FASTVT: 1
TZAT-0001SLOWVT: 1
TZAT-0002ATACH: NEGATIVE
TZAT-0002ATACH: NEGATIVE
TZAT-0002FASTVT: NEGATIVE
TZAT-0004SLOWVT: 10
TZAT-0012ATACH: 150 ms
TZAT-0012FASTVT: 200 ms
TZAT-0012SLOWVT: 200 ms
TZAT-0013SLOWVT: 3
TZAT-0018ATACH: NEGATIVE
TZAT-0018FASTVT: NEGATIVE
TZAT-0019ATACH: 6 V
TZAT-0019ATACH: 6 V
TZAT-0019SLOWVT: 8 V
TZAT-0020ATACH: 1.5 ms
TZAT-0020SLOWVT: 1.5 ms
TZON-0003ATACH: 350 ms
TZON-0003SLOWVT: 330 ms
TZST-0001ATACH: 4
TZST-0001ATACH: 5
TZST-0001FASTVT: 2
TZST-0001FASTVT: 4
TZST-0001FASTVT: 6
TZST-0001SLOWVT: 2
TZST-0001SLOWVT: 4
TZST-0001SLOWVT: 6
TZST-0002ATACH: NEGATIVE
TZST-0002FASTVT: NEGATIVE
TZST-0002FASTVT: NEGATIVE
TZST-0002FASTVT: NEGATIVE
TZST-0003SLOWVT: 15 J
TZST-0003SLOWVT: 25 J
TZST-0003SLOWVT: 35 J
TZST-0003SLOWVT: 35 J
VENTRICULAR PACING ICD: 2.34 pct

## 2012-02-24 ENCOUNTER — Encounter: Payer: Self-pay | Admitting: *Deleted

## 2012-04-03 ENCOUNTER — Other Ambulatory Visit: Payer: Self-pay

## 2012-04-08 ENCOUNTER — Other Ambulatory Visit: Payer: Self-pay | Admitting: Cardiovascular Disease

## 2012-04-08 NOTE — Telephone Encounter (Signed)
Refilled Clopidogrel. 

## 2012-04-28 ENCOUNTER — Other Ambulatory Visit: Payer: Self-pay | Admitting: *Deleted

## 2012-04-28 MED ORDER — ROSUVASTATIN CALCIUM 20 MG PO TABS: 20.0000 mg | ORAL_TABLET | Freq: Every day | ORAL | Status: DC

## 2012-05-10 ENCOUNTER — Ambulatory Visit (INDEPENDENT_AMBULATORY_CARE_PROVIDER_SITE_OTHER): Payer: Medicare Other | Admitting: *Deleted

## 2012-05-10 ENCOUNTER — Encounter: Payer: Self-pay | Admitting: Internal Medicine

## 2012-05-10 ENCOUNTER — Other Ambulatory Visit: Payer: Self-pay

## 2012-05-10 DIAGNOSIS — I2589 Other forms of chronic ischemic heart disease: Secondary | ICD-10-CM

## 2012-05-10 DIAGNOSIS — I5022 Chronic systolic (congestive) heart failure: Secondary | ICD-10-CM

## 2012-05-10 DIAGNOSIS — Z95810 Presence of automatic (implantable) cardiac defibrillator: Secondary | ICD-10-CM

## 2012-05-19 LAB — REMOTE ICD DEVICE
ATRIAL PACING ICD: 39.1 pct
CHARGE TIME: 10.66 s
DEV-0020ICD: NEGATIVE
FVT: 0
PACEART VT: 0
TOT-0001: 2
TOT-0002: 0
TZAT-0001ATACH: 1
TZAT-0001ATACH: 2
TZAT-0001FASTVT: 1
TZAT-0002ATACH: NEGATIVE
TZAT-0002ATACH: NEGATIVE
TZAT-0011SLOWVT: 10 ms
TZAT-0012FASTVT: 200 ms
TZAT-0012SLOWVT: 200 ms
TZAT-0018ATACH: NEGATIVE
TZAT-0018ATACH: NEGATIVE
TZAT-0018ATACH: NEGATIVE
TZAT-0018SLOWVT: NEGATIVE
TZAT-0019ATACH: 6 V
TZAT-0019SLOWVT: 8 V
TZAT-0020ATACH: 1.5 ms
TZAT-0020FASTVT: 1.5 ms
TZAT-0020SLOWVT: 1.5 ms
TZON-0003ATACH: 350 ms
TZON-0003SLOWVT: 330 ms
TZON-0004VSLOWVT: 32
TZON-0005SLOWVT: 12
TZST-0001FASTVT: 3
TZST-0001FASTVT: 4
TZST-0001FASTVT: 5
TZST-0001SLOWVT: 3
TZST-0001SLOWVT: 4
TZST-0001SLOWVT: 6
TZST-0002ATACH: NEGATIVE
TZST-0002ATACH: NEGATIVE
TZST-0002FASTVT: NEGATIVE
TZST-0002FASTVT: NEGATIVE
TZST-0003SLOWVT: 35 J
VENTRICULAR PACING ICD: 3.76 pct

## 2012-05-21 ENCOUNTER — Encounter: Payer: Self-pay | Admitting: Cardiovascular Disease

## 2012-05-21 ENCOUNTER — Ambulatory Visit (INDEPENDENT_AMBULATORY_CARE_PROVIDER_SITE_OTHER): Payer: Medicare Other | Admitting: Cardiovascular Disease

## 2012-05-21 VITALS — BP 108/70 | HR 66 | Ht 67.0 in | Wt 208.8 lb

## 2012-05-21 DIAGNOSIS — E785 Hyperlipidemia, unspecified: Secondary | ICD-10-CM

## 2012-05-21 DIAGNOSIS — I2589 Other forms of chronic ischemic heart disease: Secondary | ICD-10-CM

## 2012-05-21 DIAGNOSIS — I2510 Atherosclerotic heart disease of native coronary artery without angina pectoris: Secondary | ICD-10-CM

## 2012-05-21 DIAGNOSIS — I10 Essential (primary) hypertension: Secondary | ICD-10-CM

## 2012-05-21 DIAGNOSIS — I739 Peripheral vascular disease, unspecified: Secondary | ICD-10-CM

## 2012-05-21 NOTE — Progress Notes (Signed)
Patient ID: Paul Moore, male    DOB: Oct 15, 1950, 62 y.o.   MRN: [id number]  HPI Comments: Paul Moore is a 62 year old gentleman with a history of smoking for 35 years, coronary artery disease, bypass x5 in July 2008, catheterization in December 2008 for recurrent chest pain with stents to his native RCA, occluded vein graft to the PDA, 60-70% proximal vein graft disease to the diagonal,  s/p ICD implantation for primary prevention, presents for routine followup.     Paul Moore is doing well. He is exercising on a bike daily. He does report having significant stress at home. His wife has deteriorating health, worsening Mnire's disease. She is incapacitated by her symptoms. Weight is down with his diet and exercise.  No significant chest pain concerning for angina. Prior anginal symptoms included a sharp pain in his back. He does continue to have this fairly well not on a consistent basis with exertion.  echocardiogram in 2009 shows ejection fraction 20-30%, mildly dilated left ventricle, moderately enlarged right ventricle with moderately decreased systolic function.   Last stress test in November 2008, treadmill where he exercised only for 3 minutes, 5 minutes. The large region of decreased perfusion in the inferior and lateral walls no ischemia, ejection fraction 22%.   Last catheterization December 2008 with a PTCA and stenting of the RCA, Promus stent 3.0 x 28 mm. 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 aspirin 81 MG tablet Take 1 tablet  (81 mg total) by mouth daily.  30 tablet  0  . clopidogrel (PLAVIX) 75 MG tablet TAKE ONE TABLET BY MOUTH EVERY DAY  30 tablet  3  . furosemide (LASIX) 40 MG tablet Take 1 tablet (40 mg total) by mouth daily.  90 tablet  3  . isosorbide mononitrate (IMDUR) 30 MG 24 hr tablet TAKE TWO TABLETS (60 MG) BY MOUTH EVERY DAY AT BEDTIME  60 tablet  6  . lisinopril-hydrochlorothiazide (PRINZIDE,ZESTORETIC) 20-12.5 MG per tablet Take 1 tablet by mouth daily.  90 tablet  3  . potassium chloride SA (KLOR-CON M20) 20 MEQ tablet Take 1 tablet (20 mEq total) by mouth daily.  30 tablet  5  . rosuvastatin (CRESTOR) 20 MG tablet Take 1 tablet (20 mg total) by mouth daily.  30 tablet  6  . [DISCONTINUED] clopidogrel (PLAVIX) 75 MG tablet Take 1 tablet (75 mg total) by mouth daily.  30 tablet  6   No facility-administered encounter medications on file as of 05/21/2012.     Review of Systems  Constitutional: Negative.   HENT: Negative.   Eyes: Negative.   Respiratory:       Occasional episodes of brief shortness of breath at rest  Cardiovascular: Negative.   Gastrointestinal: Negative.   Skin: Negative.   Neurological: Negative.   Psychiatric/Behavioral: Negative.   All other systems reviewed and are negative.    BP 108/70  Pulse 66  Ht 5\' 7"  (1.702 m)  Wt 208 lb 12 oz (94.688 kg)  BMI 32.69 kg/m2  Physical Exam  Nursing note and vitals reviewed. Constitutional: He is oriented to person, place, and time. He appears well-developed and well-nourished.  obese  HENT:  Head: Normocephalic.  Nose: Nose normal.  Mouth/Throat: Oropharynx is clear and moist.  Eyes: Conjunctivae are normal. Pupils are equal, round, and reactive to light.  Neck: Normal range of motion. Neck supple. No JVD present.  Cardiovascular: Normal rate, regular rhythm, S1 normal, S2 normal, normal heart sounds and intact distal pulses.  Exam reveals no gallop and no friction rub.   No murmur heard. Pulmonary/Chest: Effort normal  and breath sounds normal. No respiratory distress. He has no wheezes. He has no rales. He exhibits no tenderness.  Abdominal: Soft. Bowel sounds are normal. He exhibits no distension. There is no tenderness.  Musculoskeletal: Normal range of motion. He exhibits no edema and no tenderness.  Lymphadenopathy:    He has no cervical adenopathy.  Neurological: He is alert and oriented to person, place, and time. Coordination normal.  Skin: Skin is warm and dry. No rash noted. No erythema.  Psychiatric: He has a normal mood and affect. His behavior is normal. Judgment and thought content normal.      Assessment and Plan

## 2012-05-21 NOTE — Patient Instructions (Addendum)
You are doing well. No medication changes were made.  Please call us if you have new issues that need to be addressed before your next appt.  Your physician wants you to follow-up in: 6 months.  You will receive a reminder letter in the mail two months in advance. If you don't receive a letter, please call our office to schedule the follow-up appointment.   

## 2012-05-21 NOTE — Assessment & Plan Note (Signed)
Blood pressure borderline low. He will call us if measurements continue to run low. Weight has been dropping

## 2012-05-21 NOTE — Assessment & Plan Note (Signed)
Cholesterol is at goal on the current lipid regimen. No changes to the medications were made.  

## 2012-05-21 NOTE — Assessment & Plan Note (Signed)
Continue aggressive lipid management. 

## 2012-05-21 NOTE — Assessment & Plan Note (Signed)
Doing well. Recommended he continue his same medications. Call us for dizziness. This could be exacerbated by recent weight loss

## 2012-06-07 ENCOUNTER — Ambulatory Visit: Payer: Medicare Other | Admitting: Cardiovascular Disease

## 2012-06-09 ENCOUNTER — Encounter: Payer: Self-pay | Admitting: *Deleted

## 2012-06-21 ENCOUNTER — Telehealth: Payer: Self-pay | Admitting: Internal Medicine

## 2012-06-21 NOTE — Telephone Encounter (Signed)
New Prob     Pt states he would like to reschedule his remote check, he will be unable to transmit on that day/time.

## 2012-06-21 NOTE — Telephone Encounter (Signed)
Rescheduled pt's remote check for 08-27-12. Pt aware of new date.

## 2012-06-21 NOTE — Telephone Encounter (Signed)
LMOM 1530/kwm

## 2012-07-14 ENCOUNTER — Other Ambulatory Visit: Payer: Self-pay

## 2012-07-14 MED ORDER — CLOPIDOGREL BISULFATE 75 MG PO TABS: ORAL_TABLET | ORAL | Status: DC

## 2012-07-14 MED ORDER — FUROSEMIDE 40 MG PO TABS: 40.0000 mg | ORAL_TABLET | Freq: Every day | ORAL | Status: DC

## 2012-08-30 ENCOUNTER — Telehealth: Payer: Self-pay | Admitting: *Deleted

## 2012-08-30 ENCOUNTER — Encounter: Payer: Self-pay | Admitting: Internal Medicine

## 2012-08-30 ENCOUNTER — Other Ambulatory Visit: Payer: Self-pay | Admitting: *Deleted

## 2012-08-30 ENCOUNTER — Ambulatory Visit (INDEPENDENT_AMBULATORY_CARE_PROVIDER_SITE_OTHER): Payer: Medicare Other | Admitting: *Deleted

## 2012-08-30 DIAGNOSIS — I2589 Other forms of chronic ischemic heart disease: Secondary | ICD-10-CM

## 2012-08-30 DIAGNOSIS — Z95810 Presence of automatic (implantable) cardiac defibrillator: Secondary | ICD-10-CM

## 2012-08-30 DIAGNOSIS — I5022 Chronic systolic (congestive) heart failure: Secondary | ICD-10-CM

## 2012-08-30 MED ORDER — POTASSIUM CHLORIDE CRYS ER 20 MEQ PO TBCR: 20.0000 meq | EXTENDED_RELEASE_TABLET | Freq: Every day | ORAL | Status: DC

## 2012-08-30 MED ORDER — CLOPIDOGREL BISULFATE 75 MG PO TABS: ORAL_TABLET | ORAL | Status: DC

## 2012-08-30 MED ORDER — ISOSORBIDE MONONITRATE ER 30 MG PO TB24: ORAL_TABLET | ORAL | Status: DC

## 2012-08-30 NOTE — Telephone Encounter (Signed)
Faxed Prior Authorization form for Potassium Chloride Packets 20 meq 1 tablet daily.  [employer] Prior Authorization Faxed to : [phone].

## 2012-08-30 NOTE — Telephone Encounter (Signed)
IMDUR ....needs 60 tabs

## 2012-08-30 NOTE — Telephone Encounter (Signed)
Refilled Imdur, Potassium and plavix sent to [facility].

## 2012-08-31 ENCOUNTER — Telehealth: Payer: Self-pay | Admitting: *Deleted

## 2012-08-31 NOTE — Telephone Encounter (Signed)
Disregard Prior Authorization. Pt has received medication for potassium chloride. Prior authorization was sent for Potassium Chloride packets when Rx was never sent in for the packets but the tablet on 08/30/12. Prior authorization is not needed for tablet.

## 2012-08-31 NOTE — Telephone Encounter (Signed)
Disregard Prior Authorization. Pt has received potassium chloride tablets he never requested the packets. Prior Authorization not needed.

## 2012-08-31 NOTE — Telephone Encounter (Signed)
Paul Moore with [facility] [employer] called re: Paul Moore - Potassium Chloride powder has not been approved due to not being approved with the FDA

## 2012-09-03 ENCOUNTER — Telehealth: Payer: Self-pay | Admitting: Internal Medicine

## 2012-09-03 NOTE — Telephone Encounter (Signed)
New Prob       Pt wants to know if his transmission was received. Please call.

## 2012-09-03 NOTE — Telephone Encounter (Signed)
New Prob  ° ° ° ° ° °Pt wants to know if his transmission was received. Please call. ° °

## 2012-09-06 LAB — REMOTE ICD DEVICE
BAMS-0001: 170 {beats}/min
BATTERY VOLTAGE: 3 V
CHARGE TIME: 10.82 s
DEV-0020ICD: NEGATIVE
FVT: 0
PACEART VT: 0
RV LEAD AMPLITUDE: 13.9 mv
TOT-0002: 0
TZAT-0001ATACH: 2
TZAT-0001FASTVT: 1
TZAT-0002ATACH: NEGATIVE
TZAT-0002ATACH: NEGATIVE
TZAT-0011SLOWVT: 10 ms
TZAT-0012ATACH: 150 ms
TZAT-0012SLOWVT: 200 ms
TZAT-0013SLOWVT: 3
TZAT-0018ATACH: NEGATIVE
TZAT-0018ATACH: NEGATIVE
TZAT-0019ATACH: 6 V
TZAT-0019ATACH: 6 V
TZAT-0019SLOWVT: 8 V
TZAT-0020ATACH: 1.5 ms
TZAT-0020FASTVT: 1.5 ms
TZAT-0020SLOWVT: 1.5 ms
TZON-0003SLOWVT: 330 ms
TZON-0004SLOWVT: 24
TZON-0004VSLOWVT: 32
TZON-0005SLOWVT: 12
TZST-0001ATACH: 5
TZST-0001FASTVT: 2
TZST-0001FASTVT: 3
TZST-0001FASTVT: 4
TZST-0001SLOWVT: 3
TZST-0001SLOWVT: 6
TZST-0002ATACH: NEGATIVE
TZST-0002FASTVT: NEGATIVE
TZST-0002FASTVT: NEGATIVE
TZST-0002FASTVT: NEGATIVE
TZST-0003SLOWVT: 15 J
TZST-0003SLOWVT: 25 J
TZST-0003SLOWVT: 35 J
VENTRICULAR PACING ICD: 5.61 pct

## 2012-09-09 ENCOUNTER — Encounter: Payer: Self-pay | Admitting: *Deleted

## 2012-09-27 ENCOUNTER — Ambulatory Visit (INDEPENDENT_AMBULATORY_CARE_PROVIDER_SITE_OTHER): Payer: Medicare Other | Admitting: *Deleted

## 2012-09-27 DIAGNOSIS — I5022 Chronic systolic (congestive) heart failure: Secondary | ICD-10-CM

## 2012-09-27 DIAGNOSIS — Z95810 Presence of automatic (implantable) cardiac defibrillator: Secondary | ICD-10-CM

## 2012-10-05 LAB — REMOTE ICD DEVICE
BAMS-0001: 170 {beats}/min
BATTERY VOLTAGE: 3 V
CHARGE TIME: 10.82 s
DEV-0020ICD: NEGATIVE
TOT-0006: 20090326000000
TZAT-0001ATACH: 3
TZAT-0001FASTVT: 1
TZAT-0002ATACH: NEGATIVE
TZAT-0004SLOWVT: 10
TZAT-0005SLOWVT: 84 pct
TZAT-0011SLOWVT: 10 ms
TZAT-0012ATACH: 150 ms
TZAT-0012SLOWVT: 200 ms
TZAT-0013SLOWVT: 3
TZAT-0018ATACH: NEGATIVE
TZAT-0018FASTVT: NEGATIVE
TZAT-0019ATACH: 6 V
TZAT-0019FASTVT: 8 V
TZAT-0020ATACH: 1.5 ms
TZAT-0020ATACH: 1.5 ms
TZAT-0020ATACH: 1.5 ms
TZON-0003SLOWVT: 330 ms
TZON-0003VSLOWVT: 450 ms
TZON-0004SLOWVT: 24
TZST-0001ATACH: 5
TZST-0001FASTVT: 2
TZST-0001FASTVT: 4
TZST-0001FASTVT: 6
TZST-0001SLOWVT: 5
TZST-0002ATACH: NEGATIVE
TZST-0002FASTVT: NEGATIVE
TZST-0002FASTVT: NEGATIVE
TZST-0003SLOWVT: 25 J
TZST-0003SLOWVT: 35 J
TZST-0003SLOWVT: 35 J
VENTRICULAR PACING ICD: 6.43 pct
VF: 0

## 2012-10-14 ENCOUNTER — Encounter: Payer: Self-pay | Admitting: Internal Medicine

## 2012-10-14 ENCOUNTER — Ambulatory Visit (INDEPENDENT_AMBULATORY_CARE_PROVIDER_SITE_OTHER): Payer: Medicare Other | Admitting: Internal Medicine

## 2012-10-14 VITALS — BP 124/82 | HR 64 | Ht 67.0 in | Wt 207.5 lb

## 2012-10-14 DIAGNOSIS — I10 Essential (primary) hypertension: Secondary | ICD-10-CM

## 2012-10-14 DIAGNOSIS — I2589 Other forms of chronic ischemic heart disease: Secondary | ICD-10-CM

## 2012-10-14 DIAGNOSIS — I5022 Chronic systolic (congestive) heart failure: Secondary | ICD-10-CM

## 2012-10-14 LAB — ICD DEVICE OBSERVATION
AL THRESHOLD: 1 V
ATRIAL PACING ICD: 36.13 pct
CHARGE TIME: 10.82 s
DEV-0020ICD: NEGATIVE
PACEART VT: 0
TOT-0001: 2
TOT-0002: 0
TZAT-0001ATACH: 2
TZAT-0001ATACH: 3
TZAT-0001FASTVT: 1
TZAT-0001SLOWVT: 1
TZAT-0002FASTVT: NEGATIVE
TZAT-0005SLOWVT: 84 pct
TZAT-0011SLOWVT: 10 ms
TZAT-0012ATACH: 150 ms
TZAT-0012ATACH: 150 ms
TZAT-0012ATACH: 150 ms
TZAT-0012SLOWVT: 200 ms
TZAT-0013SLOWVT: 3
TZAT-0018ATACH: NEGATIVE
TZAT-0018SLOWVT: NEGATIVE
TZAT-0019ATACH: 6 V
TZAT-0019FASTVT: 8 V
TZAT-0020ATACH: 1.5 ms
TZON-0003ATACH: 350 ms
TZON-0003VSLOWVT: 450 ms
TZON-0004SLOWVT: 24
TZON-0004VSLOWVT: 32
TZON-0005SLOWVT: 12
TZST-0001ATACH: 4
TZST-0001ATACH: 6
TZST-0001FASTVT: 3
TZST-0001SLOWVT: 3
TZST-0001SLOWVT: 5
TZST-0002ATACH: NEGATIVE
TZST-0002ATACH: NEGATIVE
TZST-0002FASTVT: NEGATIVE
TZST-0002FASTVT: NEGATIVE
TZST-0002FASTVT: NEGATIVE
TZST-0003SLOWVT: 25 J
VENTRICULAR PACING ICD: 4.03 pct

## 2012-10-14 NOTE — Progress Notes (Signed)
Patient Care Team: Gwenlyn Found as PCP - General   HPI  Paul Moore is a 62 y.o. male Seen in followup for ICD implanted for primary prevention in the setting of coronary artery disease, bypass x5 in July 2009;    The patient denies chest pain, shortness of breath, nocturnal dyspnea, orthopnea or peripheral edema. There have been no palpitations, lightheadedness or syncope.     Echocardiogram in 2011 demonstrated LV dysfunction EF 30-35% with inferolateral septal and apical wall motion abnormalities.  Last stress test in November 2008, treadmill where he exercised only for 3 minutes, 5 minutes. The large region of decreased perfusion in the inferior and lateral walls no ischemia, ejection fraction 22%.  Last catheterization December 2008 with a PTCA and stenting of the RCA, Promus stent 3.0 x 28 mm. occluded vein graft to the RCA at the ostium, severe diffuse native RCA disease with 90% proximal, 90% mid, high-grade PDA narrowing in a small vessel, moderate stenosis at the ostium of the vein graft to the diagonal, patent LIMA and patent vein graft to the circumflex  Last LDL was 77 in April.   Past Medical History  Diagnosis Date  . Hypertension   . PVD (peripheral vascular disease)   . Ischemic cardiomyopathy   . Dyslipidemia   . Coronary artery disease   . Beta-blocker intolerance        . ICD-dual-chamber-Medtronic 05/16/2009    Qualifier: Diagnosis of  By: Marinus Maw RN, BSN, Melissa      Past Surgical History  Procedure Laterality Date  . Coronary artery bypass graft    . Incise and drain abcess      perirectal  . Iliac artery stent      right  . Cardiac defibrillator placement      Current Outpatient Prescriptions  Medication Sig Dispense Refill  . aspirin 81 MG tablet Take 1 tablet (81 mg total) by mouth daily.  30 tablet  0  . clopidogrel (PLAVIX) 75 MG tablet TAKE ONE TABLET BY MOUTH EVERY DAY  30 tablet  3  . furosemide (LASIX) 40 MG tablet Take 1 tablet (40  mg total) by mouth daily.  90 tablet  3  . isosorbide mononitrate (IMDUR) 30 MG 24 hr tablet TAKE TWO TABLETS (60 MG) BY MOUTH EVERY DAY AT BEDTIME  60 tablet  3  . lisinopril-hydrochlorothiazide (PRINZIDE,ZESTORETIC) 20-12.5 MG per tablet Take 1 tablet by mouth daily.  90 tablet  3  . potassium chloride SA (KLOR-CON M20) 20 MEQ tablet Take 1 tablet (20 mEq total) by mouth daily.  30 tablet  3  . rosuvastatin (CRESTOR) 20 MG tablet Take 1 tablet (20 mg total) by mouth daily.  30 tablet  6   No current facility-administered medications for this visit.    Allergies  Allergen Reactions  . Amiodarone     REACTION: Hives    Review of Systems negative except from HPI and PMH  Physical Exam BP 124/82  Pulse 64  Ht 5\' 7"  (1.702 m)  Wt 207 lb 8 oz (94.121 kg)  BMI 32.49 kg/m2 Well developed and well nourished in no acute distress HENT normal E scleral and icterus clear Neck Supple JVP flat; carotids brisk and full Clear to ausculation Device pocket well healed; without hematoma or erythema.  There is no tethering Regular rate and rhythm, no murmurs gallops or rub Soft with active bowel sounds No clubbing cyanosis none Edema Alert and oriented, grossly normal motor and sensory function Skin Warm and Dry  Assessment and  Plan

## 2012-10-14 NOTE — Patient Instructions (Addendum)
Remote monitoring is used to monitor your Pacemaker of ICD from home. This monitoring reduces the number of office visits required to check your device to one time per year. It allows [place] to keep an eye on the functioning of your device to ensure it is working properly. You are scheduled for a device check from home on 01/17/13. You may send your transmission at any time that day. If you have a wireless device, the transmission will be sent automatically. After your physician reviews your transmission, you will receive a postcard with your next transmission date.  Your physician wants you to follow-up in: 1 year with Dr. [name]. You will receive a reminder letter in the mail two months in advance. If you don't receive a letter, please call our office to schedule the follow-up appointment.  Your physician recommends that you continue on your current medications as directed. Please refer to the Current Medication list given to you today.

## 2012-10-14 NOTE — Assessment & Plan Note (Signed)
Well controlled 

## 2012-10-14 NOTE — Assessment & Plan Note (Signed)
Stable on current medications 

## 2012-10-28 ENCOUNTER — Encounter: Payer: Self-pay | Admitting: Internal Medicine

## 2012-11-05 ENCOUNTER — Encounter: Payer: Self-pay | Admitting: *Deleted

## 2012-11-08 ENCOUNTER — Encounter: Payer: Self-pay | Admitting: Cardiovascular Disease

## 2012-11-16 ENCOUNTER — Encounter: Payer: Self-pay | Admitting: Internal Medicine

## 2012-11-22 ENCOUNTER — Other Ambulatory Visit: Payer: Self-pay | Admitting: *Deleted

## 2012-11-22 MED ORDER — ROSUVASTATIN CALCIUM 20 MG PO TABS: 20.0000 mg | ORAL_TABLET | Freq: Every day | ORAL | Status: DC

## 2012-12-03 ENCOUNTER — Encounter: Payer: Self-pay | Admitting: Cardiovascular Disease

## 2012-12-03 ENCOUNTER — Ambulatory Visit (INDEPENDENT_AMBULATORY_CARE_PROVIDER_SITE_OTHER): Payer: Medicare Other | Admitting: Cardiovascular Disease

## 2012-12-03 VITALS — BP 102/80 | HR 67 | Ht 67.0 in | Wt 204.0 lb

## 2012-12-03 DIAGNOSIS — E785 Hyperlipidemia, unspecified: Secondary | ICD-10-CM

## 2012-12-03 DIAGNOSIS — I5022 Chronic systolic (congestive) heart failure: Secondary | ICD-10-CM

## 2012-12-03 DIAGNOSIS — I4949 Other premature depolarization: Secondary | ICD-10-CM

## 2012-12-03 DIAGNOSIS — I10 Essential (primary) hypertension: Secondary | ICD-10-CM

## 2012-12-03 DIAGNOSIS — I25810 Atherosclerosis of coronary artery bypass graft(s) without angina pectoris: Secondary | ICD-10-CM

## 2012-12-03 MED ORDER — ROSUVASTATIN CALCIUM 40 MG PO TABS: 40.0000 mg | ORAL_TABLET | Freq: Every day | ORAL | Status: DC

## 2012-12-03 NOTE — Progress Notes (Signed)
Patient ID: Paul Moore, male    DOB: 05/24/1950, 62 y.o.   MRN: [id number]  HPI Comments: Paul Moore is a 62 year old gentleman with a history of smoking for 35 years, coronary artery disease, bypass x5 in July 2008, catheterization in December 2008 for recurrent chest pain with stents to his native RCA, occluded vein graft to the PDA, 60-70% proximal vein graft disease to the diagonal,  s/p ICD implantation for primary prevention, presents for routine followup.     Paul Moore is doing well. He is exercising on a bike daily. Also uses a Wii fit. His wife has had continued problems with Mnire's disease. She is incapacitated by her symptoms. Weight is down with his diet and exercise.  No significant chest pain concerning for angina. Prior anginal symptoms included a sharp pain in his back.   echocardiogram in 2009 shows ejection fraction 20-30%, mildly dilated left ventricle, moderately enlarged right ventricle with moderately decreased systolic function.   Last stress test in November 2008, treadmill where he exercised only for 3 minutes, 5 minutes. The large region of decreased perfusion in the inferior and lateral walls no ischemia, ejection fraction 22%.   Last catheterization December 2008 with a PTCA and stenting of the RCA, Promus stent 3.0 x 28 mm. occluded vein graft to the RCA at the ostium, severe diffuse native RCA disease with 90% proximal, 90% mid, high-grade PDA narrowing in a small vessel, moderate stenosis at the ostium of the vein graft to the diagonal, patent LIMA and patent vein graft to the circumflex  EKG shows normal sinus rhythm with rate 67 beats per minute, consider old inferior infarct,  T wave abnormality in V5 and V6    Outpatient Encounter Prescriptions as of 12/03/2012  Medication Sig Dispense Refill  . aspirin 81 MG tablet Take 1 tablet (81 mg total) by mouth daily.  30 tablet  0  . clopidogrel (PLAVIX) 75 MG tablet TAKE ONE TABLET BY MOUTH EVERY DAY  30 tablet  3   . furosemide (LASIX) 40 MG tablet Take 1 tablet (40 mg total) by mouth daily.  90 tablet  3  . isosorbide mononitrate (IMDUR) 30 MG 24 hr tablet TAKE TWO TABLETS (60 MG) BY MOUTH EVERY DAY AT BEDTIME  60 tablet  3  . lisinopril-hydrochlorothiazide (PRINZIDE,ZESTORETIC) 20-12.5 MG per tablet Take 1 tablet by mouth daily.  90 tablet  3  . potassium chloride SA (KLOR-CON M20) 20 MEQ tablet Take 1 tablet (20 mEq total) by mouth daily.  30 tablet  3  . rosuvastatin (CRESTOR) 20 MG tablet Take 1 tablet (20 mg total) by mouth daily.  30 tablet  6   No facility-administered encounter medications on file as of 12/03/2012.     Review of Systems  Constitutional: Negative.   HENT: Negative.   Eyes: Negative.   Respiratory: Negative.   Cardiovascular: Negative.   Gastrointestinal: Negative.   Endocrine: Negative.   Musculoskeletal: Negative.   Skin: Negative.   Allergic/Immunologic: Negative.   Neurological: Negative.   Hematological: Negative.   Psychiatric/Behavioral: Negative.   All other systems reviewed and are negative.    BP 102/80  Pulse 67  Ht 5\' 7"  (1.702 m)  Wt 204 lb (92.534 kg)  BMI 31.94 kg/m2  Physical Exam  Nursing note and vitals reviewed. Constitutional: He is oriented to person, place, and time. He appears well-developed and well-nourished.  obese  HENT:  Head: Normocephalic.  Nose: Nose normal.  Mouth/Throat: Oropharynx is clear and moist.  Eyes: Conjunctivae are normal. Pupils are equal, round, and reactive to light.  Neck: Normal range of motion. Neck supple. No JVD present.  Cardiovascular: Normal rate, regular rhythm, S1 normal, S2 normal, normal heart sounds and intact distal pulses.  Exam reveals no gallop and no friction rub.   No murmur heard. Pulmonary/Chest: Effort normal and breath sounds normal. No respiratory distress. He has no wheezes. He has no rales. He exhibits no tenderness.  Abdominal: Soft. Bowel sounds are normal. He exhibits no  distension. There is no tenderness.  Musculoskeletal: Normal range of motion. He exhibits no edema and no tenderness.  Lymphadenopathy:    He has no cervical adenopathy.  Neurological: He is alert and oriented to person, place, and time. Coordination normal.  Skin: Skin is warm and dry. No rash noted. No erythema.  Psychiatric: He has a normal mood and affect. His behavior is normal. Judgment and thought content normal.      Assessment and Plan

## 2012-12-03 NOTE — Assessment & Plan Note (Signed)
Currently with no symptoms of angina. No further workup at this time. Continue current medication regimen. 

## 2012-12-03 NOTE — Patient Instructions (Addendum)
You are doing well.  If blood pressure runs low, you could cut the lisinopril HCTZ in 1/2 Try a little extra crestor up to 40 mg  If no side effects, call the office for new script  Please call us if you have new issues that need to be addressed before your next appt.  Your physician wants you to follow-up in: 6 months.  You will receive a reminder letter in the mail two months in advance. If you don't receive a letter, please call our office to schedule the follow-up appointment.

## 2012-12-03 NOTE — Assessment & Plan Note (Signed)
Blood pressure is well controlled on today's visit. No changes made to the medications. 

## 2012-12-03 NOTE — Assessment & Plan Note (Signed)
Cholesterol is at goal on the current lipid regimen. No changes to the medications were made.  

## 2012-12-03 NOTE — Assessment & Plan Note (Signed)
Appears to be relatively euvolemic. We'll continue him on his current medications.

## 2012-12-06 ENCOUNTER — Other Ambulatory Visit: Payer: Self-pay | Admitting: *Deleted

## 2012-12-06 MED ORDER — CLOPIDOGREL BISULFATE 75 MG PO TABS: ORAL_TABLET | ORAL | Status: DC

## 2012-12-06 NOTE — Telephone Encounter (Signed)
Requested Prescriptions   Signed Prescriptions Disp Refills  . clopidogrel (PLAVIX) 75 MG tablet 30 tablet 3    Sig: TAKE ONE TABLET BY MOUTH EVERY DAY    Authorizing Provider: Antonieta Iba    Ordering User: Kendrick Fries

## 2012-12-23 ENCOUNTER — Other Ambulatory Visit: Payer: Self-pay

## 2012-12-27 ENCOUNTER — Other Ambulatory Visit: Payer: Self-pay | Admitting: *Deleted

## 2012-12-27 MED ORDER — POTASSIUM CHLORIDE CRYS ER 20 MEQ PO TBCR: 20.0000 meq | EXTENDED_RELEASE_TABLET | Freq: Every day | ORAL | Status: DC

## 2012-12-27 MED ORDER — LISINOPRIL-HYDROCHLOROTHIAZIDE 20-12.5 MG PO TABS: 1.0000 | ORAL_TABLET | Freq: Every day | ORAL | Status: DC

## 2012-12-28 ENCOUNTER — Other Ambulatory Visit: Payer: Self-pay | Admitting: *Deleted

## 2012-12-28 MED ORDER — ISOSORBIDE MONONITRATE ER 30 MG PO TB24: ORAL_TABLET | ORAL | Status: DC

## 2012-12-28 NOTE — Telephone Encounter (Signed)
Requested Prescriptions   Signed Prescriptions Disp Refills  . isosorbide mononitrate (IMDUR) 30 MG 24 hr tablet 60 tablet 3    Sig: TAKE TWO TABLETS (60 MG) BY MOUTH EVERY DAY AT BEDTIME    Authorizing Provider: Antonieta Iba    Ordering User: Kendrick Fries

## 2013-01-17 ENCOUNTER — Ambulatory Visit (INDEPENDENT_AMBULATORY_CARE_PROVIDER_SITE_OTHER): Payer: Medicare Other | Admitting: *Deleted

## 2013-01-17 ENCOUNTER — Encounter: Payer: Self-pay | Admitting: Internal Medicine

## 2013-01-17 DIAGNOSIS — I5022 Chronic systolic (congestive) heart failure: Secondary | ICD-10-CM

## 2013-01-17 DIAGNOSIS — Z95810 Presence of automatic (implantable) cardiac defibrillator: Secondary | ICD-10-CM

## 2013-01-17 DIAGNOSIS — I2589 Other forms of chronic ischemic heart disease: Secondary | ICD-10-CM

## 2013-01-17 LAB — MDC_IDC_ENUM_SESS_TYPE_REMOTE
Brady Statistic AP VP Percent: 1.49 %
Brady Statistic AP VS Percent: 41.52 %
Brady Statistic RA Percent Paced: 43.01 %
Brady Statistic RV Percent Paced: 3.57 %
HighPow Impedance: 57 Ohm
Lead Channel Impedance Value: 592 Ohm
Lead Channel Sensing Intrinsic Amplitude: 3.4496
Lead Channel Setting Pacing Amplitude: 2 V
Lead Channel Setting Pacing Amplitude: 2 V
Lead Channel Setting Pacing Pulse Width: 0.4 ms
Lead Channel Setting Sensing Sensitivity: 0.6 mV
Zone Setting Detection Interval: 290 ms
Zone Setting Detection Interval: 330 ms
Zone Setting Detection Interval: 350 ms
Zone Setting Detection Interval: 450 ms

## 2013-02-01 ENCOUNTER — Encounter: Payer: Self-pay | Admitting: *Deleted

## 2013-03-25 ENCOUNTER — Other Ambulatory Visit: Payer: Self-pay

## 2013-03-25 ENCOUNTER — Other Ambulatory Visit: Payer: Self-pay | Admitting: *Deleted

## 2013-03-25 MED ORDER — ISOSORBIDE MONONITRATE ER 30 MG PO TB24: ORAL_TABLET | ORAL | Status: DC

## 2013-03-25 MED ORDER — CLOPIDOGREL BISULFATE 75 MG PO TABS: ORAL_TABLET | ORAL | Status: DC

## 2013-03-25 MED ORDER — LISINOPRIL-HYDROCHLOROTHIAZIDE 20-12.5 MG PO TABS: 1.0000 | ORAL_TABLET | Freq: Every day | ORAL | Status: DC

## 2013-03-25 MED ORDER — POTASSIUM CHLORIDE CRYS ER 20 MEQ PO TBCR: 20.0000 meq | EXTENDED_RELEASE_TABLET | Freq: Every day | ORAL | Status: DC

## 2013-03-25 MED ORDER — FUROSEMIDE 40 MG PO TABS: 40.0000 mg | ORAL_TABLET | Freq: Every day | ORAL | Status: DC

## 2013-03-25 MED ORDER — ROSUVASTATIN CALCIUM 40 MG PO TABS: 40.0000 mg | ORAL_TABLET | Freq: Every day | ORAL | Status: DC

## 2013-03-25 NOTE — Telephone Encounter (Signed)
Error

## 2013-03-25 NOTE — Telephone Encounter (Signed)
Requested Prescriptions   Signed Prescriptions Disp Refills  . potassium chloride SA (KLOR-CON M20) 20 MEQ tablet 90 tablet 3    Sig: Take 1 tablet (20 mEq total) by mouth daily.    Authorizing Provider: Antonieta Iba    Ordering User: Shawnie Dapper, Betania Dizon C  . rosuvastatin (CRESTOR) 40 MG tablet 90 tablet 3    Sig: Take 1 tablet (40 mg total) by mouth daily.    Authorizing Provider: Antonieta Iba    Ordering User: Shawnie Dapper, Correen Bubolz C  . isosorbide mononitrate (IMDUR) 30 MG 24 hr tablet 180 tablet 3    Sig: TAKE TWO TABLETS (60 MG) BY MOUTH EVERY DAY AT BEDTIME    Authorizing Provider: Antonieta Iba    Ordering User: Shawnie Dapper, Trig Mcbryar C  . furosemide (LASIX) 40 MG tablet 90 tablet 3    Sig: Take 1 tablet (40 mg total) by mouth daily.    Authorizing Provider: Antonieta Iba    Ordering User: Shawnie Dapper, Windie Marasco C  . clopidogrel (PLAVIX) 75 MG tablet 90 tablet 3    Sig: TAKE ONE TABLET BY MOUTH EVERY DAY    Authorizing Provider: Antonieta Iba    Ordering User: Shawnie Dapper, Ivie Savitt C  . lisinopril-hydrochlorothiazide (PRINZIDE,ZESTORETIC) 20-12.5 MG per tablet 90 tablet 3    Sig: Take 1 tablet by mouth daily.    Authorizing Provider: Antonieta Iba    Ordering User: Kendrick Fries

## 2013-04-20 ENCOUNTER — Ambulatory Visit (INDEPENDENT_AMBULATORY_CARE_PROVIDER_SITE_OTHER): Payer: Commercial Managed Care - HMO | Admitting: *Deleted

## 2013-04-20 DIAGNOSIS — I5022 Chronic systolic (congestive) heart failure: Secondary | ICD-10-CM

## 2013-04-20 DIAGNOSIS — I428 Other cardiomyopathies: Secondary | ICD-10-CM

## 2013-04-24 LAB — MDC_IDC_ENUM_SESS_TYPE_REMOTE
Battery Voltage: 2.95 V
Brady Statistic AP VP Percent: 1.09 %
Brady Statistic AS VS Percent: 39.59 %
Brady Statistic RA Percent Paced: 59.8 %
Brady Statistic RV Percent Paced: 1.7 %
HighPow Impedance: 42 Ohm
HighPow Impedance: 56 Ohm
Lead Channel Impedance Value: 496 Ohm
Lead Channel Setting Pacing Amplitude: 2 V
Lead Channel Setting Pacing Amplitude: 2 V
Lead Channel Setting Pacing Pulse Width: 0.4 ms
MDC IDC MSMT LEADCHNL RA IMPEDANCE VALUE: 536 Ohm
MDC IDC MSMT LEADCHNL RA SENSING INTR AMPL: 3.3634
MDC IDC MSMT LEADCHNL RV SENSING INTR AMPL: 12.8653
MDC IDC SESS DTM: 20150304154610
MDC IDC SET LEADCHNL RV SENSING SENSITIVITY: 0.6 mV
MDC IDC SET ZONE DETECTION INTERVAL: 450 ms
MDC IDC STAT BRADY AP VS PERCENT: 58.7 %
MDC IDC STAT BRADY AS VP PERCENT: 0.61 %
Zone Setting Detection Interval: 290 ms
Zone Setting Detection Interval: 330 ms
Zone Setting Detection Interval: 350 ms

## 2013-05-05 ENCOUNTER — Encounter: Payer: Self-pay | Admitting: *Deleted

## 2013-05-09 ENCOUNTER — Encounter: Payer: Self-pay | Admitting: *Deleted

## 2013-05-17 ENCOUNTER — Encounter: Payer: Self-pay | Admitting: Internal Medicine

## 2013-06-02 ENCOUNTER — Ambulatory Visit (INDEPENDENT_AMBULATORY_CARE_PROVIDER_SITE_OTHER): Payer: Commercial Managed Care - HMO | Admitting: Cardiovascular Disease

## 2013-06-02 ENCOUNTER — Encounter: Payer: Self-pay | Admitting: Cardiovascular Disease

## 2013-06-02 VITALS — BP 110/80 | HR 65 | Ht 67.0 in | Wt 201.0 lb

## 2013-06-02 DIAGNOSIS — Z95810 Presence of automatic (implantable) cardiac defibrillator: Secondary | ICD-10-CM

## 2013-06-02 DIAGNOSIS — I10 Essential (primary) hypertension: Secondary | ICD-10-CM

## 2013-06-02 DIAGNOSIS — I25810 Atherosclerosis of coronary artery bypass graft(s) without angina pectoris: Secondary | ICD-10-CM

## 2013-06-02 DIAGNOSIS — E785 Hyperlipidemia, unspecified: Secondary | ICD-10-CM

## 2013-06-02 DIAGNOSIS — I739 Peripheral vascular disease, unspecified: Secondary | ICD-10-CM

## 2013-06-02 DIAGNOSIS — I4949 Other premature depolarization: Secondary | ICD-10-CM

## 2013-06-02 DIAGNOSIS — I5022 Chronic systolic (congestive) heart failure: Secondary | ICD-10-CM

## 2013-06-02 NOTE — Assessment & Plan Note (Signed)
Blood pressure is well controlled on today's visit. No changes made to the medications. 

## 2013-06-02 NOTE — Assessment & Plan Note (Signed)
Defibrillator followed by Dr. [name]. Paced rhythm on today's visit

## 2013-06-02 NOTE — Assessment & Plan Note (Signed)
Recommended he continue on his current medications. LDL slightly above goal, glucose level rising over the past several years, still borderline diabetes

## 2013-06-02 NOTE — Progress Notes (Signed)
Patient ID: Paul Moore, male    DOB: [birth year], 63 y.o.   MRN: [id number]  HPI Comments: Paul Moore is a 63 year old gentleman with a history of smoking for 35 years, coronary artery disease, bypass x5 in July 2008, catheterization in December 2008 for recurrent chest pain with stents to his native RCA, occluded vein graft to the PDA, 60-70% proximal vein graft disease to the diagonal,  s/p ICD implantation for primary prevention, presents for routine followup.     Paul. Clementeen Moore is doing well. He is exercising on a bike daily.   Weight is down with his diet and exercise.  No significant chest pain concerning for angina. Prior anginal symptoms included a sharp pain in his back.  He is lifting weights at the gym His wife has problems with Mnire's disease. She is incapacitated by her symptoms.  echocardiogram in 2009 shows ejection fraction 20-30%, mildly dilated left ventricle, moderately enlarged right ventricle with moderately decreased systolic function.   Last stress test in November 2008, treadmill where he exercised only for 3 minutes, 5 minutes. The large region of decreased perfusion in the inferior and lateral walls no ischemia, ejection fraction 22%.   Last catheterization December 2008 with a PTCA and stenting of the RCA, Promus stent 3.0 x 28 mm. occluded vein graft to the RCA at the ostium, severe diffuse native RCA disease with 90% proximal, 90% mid, high-grade PDA narrowing in a small vessel, moderate stenosis at the ostium of the vein graft to the diagonal, patent LIMA and patent vein graft to the circumflex  Total cholesterol in September 2014 was 148, LDL 84, glucose 127  EKG shows atrial paced rhythm, nonspecific T wave abnormality anterolateral leads    Outpatient Encounter Prescriptions as of 06/02/2013  Medication Sig  . aspirin 81 MG tablet Take 1 tablet (81 mg total) by mouth daily.  . clopidogrel (PLAVIX) 75 MG tablet TAKE ONE TABLET BY MOUTH EVERY DAY  . furosemide  (LASIX) 40 MG tablet Take 1 tablet (40 mg total) by mouth daily.  . isosorbide mononitrate (IMDUR) 30 MG 24 hr tablet TAKE TWO TABLETS (60 MG) BY MOUTH EVERY DAY AT BEDTIME  . lisinopril-hydrochlorothiazide (PRINZIDE,ZESTORETIC) 20-12.5 MG per tablet Take 1 tablet by mouth daily.  . potassium chloride SA (KLOR-CON M20) 20 MEQ tablet Take 1 tablet (20 mEq total) by mouth daily.  . rosuvastatin (CRESTOR) 40 MG tablet Take 1 tablet (40 mg total) by mouth daily.   Review of Systems  Constitutional: Negative.   HENT: Negative.   Eyes: Negative.   Respiratory: Negative.   Cardiovascular: Negative.   Gastrointestinal: Negative.   Endocrine: Negative.   Musculoskeletal: Negative.   Skin: Negative.   Allergic/Immunologic: Negative.   Neurological: Negative.   Hematological: Negative.   Psychiatric/Behavioral: Negative.   All other systems reviewed and are negative.   BP 110/80  Pulse 65  Ht 5\' 7"  (1.702 m)  Wt 201 lb (91.173 kg)  BMI 31.47 kg/m2  Physical Exam  Nursing note and vitals reviewed. Constitutional: He is oriented to person, place, and time. He appears well-developed and well-nourished.  obese  HENT:  Head: Normocephalic.  Nose: Nose normal.  Mouth/Throat: Oropharynx is clear and moist.  Eyes: Conjunctivae are normal. Pupils are equal, round, and reactive to light.  Neck: Normal range of motion. Neck supple. No JVD present.  Cardiovascular: Normal rate, regular rhythm, S1 normal, S2 normal, normal heart sounds and intact distal pulses.  Exam reveals no gallop and no friction rub.  No murmur heard. Pulmonary/Chest: Effort normal and breath sounds normal. No respiratory distress. He has no wheezes. He has no rales. He exhibits no tenderness.  Abdominal: Soft. Bowel sounds are normal. He exhibits no distension. There is no tenderness.  Musculoskeletal: Normal range of motion. He exhibits no edema and no tenderness.  Lymphadenopathy:    He has no cervical adenopathy.   Neurological: He is alert and oriented to person, place, and time. Coordination normal.  Skin: Skin is warm and dry. No rash noted. No erythema.  Psychiatric: He has a normal mood and affect. His behavior is normal. Judgment and thought content normal.      Assessment and Plan

## 2013-06-02 NOTE — Assessment & Plan Note (Signed)
Currently with no symptoms of angina. No further workup at this time. Continue current medication regimen. 

## 2013-06-02 NOTE — Assessment & Plan Note (Signed)
Appears euvolemic on today's visit. No medication changes made

## 2013-06-02 NOTE — Assessment & Plan Note (Signed)
Mild abdominal aortic bruit appreciated. Prior stenting to his right iliac vessel. Prior ABIs several years ago were normal. Currently with no claudication symptoms   

## 2013-06-02 NOTE — Patient Instructions (Signed)
You are doing well. No medication changes were made.  Please call us if you have new issues that need to be addressed before your next appt.  Your physician wants you to follow-up in: 6 months.  You will receive a reminder letter in the mail two months in advance. If you don't receive a letter, please call our office to schedule the follow-up appointment.   

## 2013-07-25 ENCOUNTER — Ambulatory Visit (INDEPENDENT_AMBULATORY_CARE_PROVIDER_SITE_OTHER): Payer: Medicare HMO | Admitting: *Deleted

## 2013-07-25 DIAGNOSIS — I2589 Other forms of chronic ischemic heart disease: Secondary | ICD-10-CM

## 2013-07-25 DIAGNOSIS — I5022 Chronic systolic (congestive) heart failure: Secondary | ICD-10-CM

## 2013-07-25 LAB — MDC_IDC_ENUM_SESS_TYPE_REMOTE
Battery Voltage: 2.88 V
Brady Statistic AP VS Percent: 42.78 %
Brady Statistic AS VP Percent: 2.28 %
Brady Statistic RV Percent Paced: 4.2 %
Date Time Interrogation Session: 20150608130031
HighPow Impedance: 45 Ohm
HighPow Impedance: 59 Ohm
Lead Channel Impedance Value: 520 Ohm
Lead Channel Sensing Intrinsic Amplitude: 15.2352
Lead Channel Setting Sensing Sensitivity: 0.6 mV
MDC IDC MSMT LEADCHNL RA SENSING INTR AMPL: 3.3634
MDC IDC MSMT LEADCHNL RV IMPEDANCE VALUE: 520 Ohm
MDC IDC SET LEADCHNL RA PACING AMPLITUDE: 2 V
MDC IDC SET LEADCHNL RV PACING AMPLITUDE: 2 V
MDC IDC SET LEADCHNL RV PACING PULSEWIDTH: 0.4 ms
MDC IDC SET ZONE DETECTION INTERVAL: 290 ms
MDC IDC SET ZONE DETECTION INTERVAL: 350 ms
MDC IDC STAT BRADY AP VP PERCENT: 1.93 %
MDC IDC STAT BRADY AS VS PERCENT: 53.02 %
MDC IDC STAT BRADY RA PERCENT PACED: 44.7 %
Zone Setting Detection Interval: 330 ms
Zone Setting Detection Interval: 450 ms

## 2013-07-25 NOTE — Progress Notes (Signed)
Remote ICD transmission.   

## 2013-08-09 ENCOUNTER — Encounter: Payer: Self-pay | Admitting: Cardiology

## 2013-08-11 ENCOUNTER — Encounter: Payer: Self-pay | Admitting: Internal Medicine

## 2013-10-11 ENCOUNTER — Encounter: Payer: Self-pay | Admitting: Internal Medicine

## 2013-10-11 ENCOUNTER — Ambulatory Visit (INDEPENDENT_AMBULATORY_CARE_PROVIDER_SITE_OTHER): Payer: Medicare HMO | Admitting: Internal Medicine

## 2013-10-11 VITALS — BP 128/73 | HR 66 | Ht 67.0 in | Wt 186.5 lb

## 2013-10-11 DIAGNOSIS — I2589 Other forms of chronic ischemic heart disease: Secondary | ICD-10-CM

## 2013-10-11 DIAGNOSIS — I5022 Chronic systolic (congestive) heart failure: Secondary | ICD-10-CM

## 2013-10-11 DIAGNOSIS — I4949 Other premature depolarization: Secondary | ICD-10-CM

## 2013-10-11 LAB — MDC_IDC_ENUM_SESS_TYPE_INCLINIC
Battery Voltage: 2.82 V
Brady Statistic AP VS Percent: 50.1 %
Brady Statistic RA Percent Paced: 52.54 %
Brady Statistic RV Percent Paced: 4.18 %
Date Time Interrogation Session: 20150825102914
HIGH POWER IMPEDANCE MEASURED VALUE: 43 Ohm
HighPow Impedance: 57 Ohm
Lead Channel Pacing Threshold Amplitude: 0.5 V
Lead Channel Pacing Threshold Pulse Width: 0.4 ms
Lead Channel Sensing Intrinsic Amplitude: 4.8294
Lead Channel Setting Pacing Amplitude: 2 V
Lead Channel Setting Pacing Pulse Width: 0.4 ms
Lead Channel Setting Sensing Sensitivity: 0.6 mV
MDC IDC MSMT LEADCHNL RA IMPEDANCE VALUE: 536 Ohm
MDC IDC MSMT LEADCHNL RA PACING THRESHOLD PULSEWIDTH: 0.4 ms
MDC IDC MSMT LEADCHNL RV IMPEDANCE VALUE: 504 Ohm
MDC IDC MSMT LEADCHNL RV PACING THRESHOLD AMPLITUDE: 1 V
MDC IDC MSMT LEADCHNL RV SENSING INTR AMPL: 14.5581
MDC IDC SET LEADCHNL RA PACING AMPLITUDE: 2 V
MDC IDC SET ZONE DETECTION INTERVAL: 330 ms
MDC IDC SET ZONE DETECTION INTERVAL: 450 ms
MDC IDC STAT BRADY AP VP PERCENT: 2.45 %
MDC IDC STAT BRADY AS VP PERCENT: 1.74 %
MDC IDC STAT BRADY AS VS PERCENT: 45.72 %
Zone Setting Detection Interval: 290 ms
Zone Setting Detection Interval: 350 ms

## 2013-10-11 NOTE — Progress Notes (Signed)
Patient Care Team: Dione Housekeeper, MD as PCP - General   HPI  Paul Moore is a 63 y.o. male Seen in followup for ICD implanted for primary prevention in the setting of coronary artery disease, bypass x5 in July 2009;   The patient denies chest pain, shortness of breath, nocturnal dyspnea, orthopnea or peripheral edema. There have been no palpitations, lightheadedness or syncope.   He is seeing Dr. Knute Neu the last few months.  Echocardiogram in 2011 demonstrated LV dysfunction EF 30-35% with inferolateral septal and apical wall motion abnormalities.  Last stress test in November 2008, treadmill where he exercised only for 3 minutes, 5 minutes. The large region of decreased perfusion in the inferior and lateral walls no ischemia, ejection fraction 22%.  Last catheterization December 2008 with a PTCA and stenting of the RCA, Promus stent 3.0 x 28 mm. occluded vein graft to the RCA at the ostium, severe diffuse native RCA disease with 90% proximal, 90% mid, high-grade PDA narrowing in a small vessel, moderate stenosis at the ostium of the vein graft to the diagonal, patent LIMA and patent vein graft to the circumflex   His wife is incapacitated by Mnire's disease   Past Medical History  Diagnosis Date  . Hypertension   . PVD (peripheral vascular disease)   . Ischemic cardiomyopathy   . Dyslipidemia   . Coronary artery disease   . Beta-blocker intolerance        . ICD-dual-chamber-Medtronic 05/16/2009    Qualifier: Diagnosis of  By: Marinus Maw RN, BSN, Melissa      Past Surgical History  Procedure Laterality Date  . Coronary artery bypass graft    . Incise and drain abcess      perirectal  . Iliac artery stent      right  . Cardiac defibrillator placement      Current Outpatient Prescriptions  Medication Sig Dispense Refill  . aspirin 81 MG tablet Take 1 tablet (81 mg total) by mouth daily.  30 tablet  0  . clopidogrel (PLAVIX) 75 MG tablet TAKE ONE TABLET BY MOUTH  EVERY DAY  90 tablet  3  . furosemide (LASIX) 40 MG tablet Take 1 tablet (40 mg total) by mouth daily.  90 tablet  3  . isosorbide mononitrate (IMDUR) 30 MG 24 hr tablet TAKE TWO TABLETS (60 MG) BY MOUTH EVERY DAY AT BEDTIME  180 tablet  3  . lisinopril-hydrochlorothiazide (PRINZIDE,ZESTORETIC) 20-12.5 MG per tablet Take 1 tablet by mouth daily.  90 tablet  3  . potassium chloride SA (KLOR-CON M20) 20 MEQ tablet Take 1 tablet (20 mEq total) by mouth daily.  90 tablet  3  . rosuvastatin (CRESTOR) 40 MG tablet Take 1 tablet (40 mg total) by mouth daily.  90 tablet  3   No current facility-administered medications for this visit.    Allergies  Allergen Reactions  . Amiodarone     REACTION: Hives    Review of Systems negative except from HPI and PMH  Physical Exam BP 128/73  Pulse 66  Ht  (1.702 m)  Wt 186 lb 8 oz (84.596 kg)  BMI 29.20 kg/m2 Well developed and well nourished in no acute distress HENT normal E scleral and icterus clear Neck Supple JVP flat; carotids brisk and full Clear to ausculation Device pocket well healed; without hematoma or erythema.  There is no tethering Regular rate and rhythm, no murmurs gallops or rub Soft with active bowel sounds No clubbing cyanosis none Edema Alert  and oriented, grossly normal motor and sensory function Skin Warm and Dry  ECG demonstrates sinus rhythm with intermittent atrial pacing Intervals 22/09/39  Assessment and  Plan  Sinus node dysfunction  Ischemic cardiomyopathy  Congestive heart failure-chronic-systolic-class II  Implantable defibrillator-Medtronic dual chamber  The patient's device was interrogated.  The information was reviewed. No changes were made in the programming.    He is euvolemic; continue current medications  No interval ischemia  Exercise tolerance is adequate with rate response programming

## 2013-10-11 NOTE — Patient Instructions (Signed)
Remote monitoring is used to monitor your Pacemaker of ICD from home. This monitoring reduces the number of office visits required to check your device to one time per year. It allows [place] to keep an eye on the functioning of your device to ensure it is working properly. You are scheduled for a device check from home on 01/16/14. You may send your transmission at any time that day. If you have a wireless device, the transmission will be sent automatically. After your physician reviews your transmission, you will receive a postcard with your next transmission date.   Your physician wants you to follow-up in: 1 year with Dr. [name]. You will receive a reminder letter in the mail two months in advance. If you don't receive a letter, please call our office to schedule the follow-up appointment.

## 2013-10-13 LAB — HEPATITIS C ANTIBODY: Hep C Virus Ab: NONREACTIVE

## 2013-11-29 ENCOUNTER — Ambulatory Visit: Payer: Medicare HMO | Admitting: Cardiovascular Disease

## 2013-12-09 ENCOUNTER — Ambulatory Visit: Payer: Medicare HMO | Admitting: Cardiovascular Disease

## 2013-12-19 ENCOUNTER — Encounter: Payer: Self-pay | Admitting: Cardiovascular Disease

## 2013-12-19 ENCOUNTER — Ambulatory Visit (INDEPENDENT_AMBULATORY_CARE_PROVIDER_SITE_OTHER): Payer: Medicare HMO | Admitting: Cardiovascular Disease

## 2013-12-19 VITALS — BP 122/78 | HR 68 | Ht 67.0 in | Wt 184.8 lb

## 2013-12-19 DIAGNOSIS — I25719 Atherosclerosis of autologous vein coronary artery bypass graft(s) with unspecified angina pectoris: Secondary | ICD-10-CM

## 2013-12-19 DIAGNOSIS — E1151 Type 2 diabetes mellitus with diabetic peripheral angiopathy without gangrene: Secondary | ICD-10-CM

## 2013-12-19 DIAGNOSIS — E785 Hyperlipidemia, unspecified: Secondary | ICD-10-CM

## 2013-12-19 DIAGNOSIS — I10 Essential (primary) hypertension: Secondary | ICD-10-CM

## 2013-12-19 DIAGNOSIS — I255 Ischemic cardiomyopathy: Secondary | ICD-10-CM

## 2013-12-19 DIAGNOSIS — Z95810 Presence of automatic (implantable) cardiac defibrillator: Secondary | ICD-10-CM

## 2013-12-19 MED ORDER — LOSARTAN POTASSIUM-HCTZ 50-12.5 MG PO TABS: 1.0000 | ORAL_TABLET | Freq: Every day | ORAL | Status: DC

## 2013-12-19 MED ORDER — LOSARTAN POTASSIUM 50 MG PO TABS: 50.0000 mg | ORAL_TABLET | Freq: Every day | ORAL | Status: DC

## 2013-12-19 NOTE — Assessment & Plan Note (Signed)
Followed by Dr. Klein 

## 2013-12-19 NOTE — Assessment & Plan Note (Signed)
Mild abdominal aortic bruit appreciated. Prior stenting to his right iliac vessel. Prior ABIs several years ago were normal. Currently with no claudication symptoms

## 2013-12-19 NOTE — Assessment & Plan Note (Signed)
Medication changes as above. Hold the lisinopril, start low-dose losartan Suggested he monitor his blood pressure. If it continues to run low, potentially could decrease the dose of isosorbide

## 2013-12-19 NOTE — Assessment & Plan Note (Signed)
Currently with no symptoms of angina. No further workup at this time. Continue current medication regimen. 

## 2013-12-19 NOTE — Progress Notes (Signed)
Patient ID: Paul Moore, male    DOB: [birth year], 63 y.o.   MRN: [id number]  HPI Comments: Paul Moore is a 63 year old gentleman with a history of smoking for 35 years, coronary artery disease, bypass x5 in July 2008, catheterization in December 2008 for recurrent chest pain with stents to his native RCA, occluded vein graft to the PDA, 60-70% proximal vein graft disease to the diagonal,  s/p ICD implantation for primary prevention, presents for routine followup.   Prior anginal symptoms included a sharp pain in his back.     Paul. Clementeen Moore is doing well. He is exercising on a bike daily.  Since his last clinic visit, his weight has decreased. Blood pressures running low, occasional dizziness with standing No significant chest pain concerning for angina. He is lifting weights at the gym He does report a chronic cough His wife has problems with Mnire's disease. She is incapacitated by her symptoms.  echocardiogram in 2009 shows ejection fraction 20-30%, mildly dilated left ventricle, moderately enlarged right ventricle with moderately decreased systolic function.   Last stress test in November 2008, treadmill where he exercised only for 3 minutes, 5 minutes. The large region of decreased perfusion in the inferior and lateral walls no ischemia, ejection fraction 22%.   Last catheterization December 2008 with a PTCA and stenting of the RCA, Promus stent 3.0 x 28 mm. occluded vein graft to the RCA at the ostium, severe diffuse native RCA disease with 90% proximal, 90% mid, high-grade PDA narrowing in a small vessel, moderate stenosis at the ostium of the vein graft to the diagonal, patent LIMA and patent vein graft to the circumflex  Total cholesterol in September 2014 was 148, LDL 84, glucose 127 Most recent cholesterol 109, LDL 53  EKG shows atrial paced rhythm, rate 62 bpm,nonspecific T wave abnormality anterolateral leads, old inferior MI    Outpatient Encounter Prescriptions as of 12/19/2013   Medication Sig  . aspirin 81 MG tablet Take 1 tablet (81 mg total) by mouth daily.  . clopidogrel (PLAVIX) 75 MG tablet TAKE ONE TABLET BY MOUTH EVERY DAY  . furosemide (LASIX) 40 MG tablet Take 1 tablet (40 mg total) by mouth daily.  . isosorbide mononitrate (IMDUR) 30 MG 24 hr tablet TAKE TWO TABLETS (60 MG) BY MOUTH EVERY DAY AT BEDTIME  . lisinopril-hydrochlorothiazide (PRINZIDE,ZESTORETIC) 20-12.5 MG per tablet Take 1 tablet by mouth daily.  . potassium chloride SA (KLOR-CON M20) 20 MEQ tablet Take 1 tablet (20 mEq total) by mouth daily.  . rosuvastatin (CRESTOR) 40 MG tablet Take 1 tablet (40 mg total) by mouth daily.   Review of Systems  Constitutional: Negative.   HENT: Negative.   Eyes: Negative.   Respiratory: Negative.   Cardiovascular: Negative.   Gastrointestinal: Negative.   Endocrine: Negative.   Musculoskeletal: Negative.   Skin: Negative.   Allergic/Immunologic: Negative.   Neurological: Negative.   Hematological: Negative.   Psychiatric/Behavioral: Negative.   All other systems reviewed and are negative.   BP 122/78 mmHg  Ht 5\' 7"  (1.702 m)  Wt 184 lb 12 oz (83.802 kg)  BMI 28.93 kg/m2  Physical Exam  Constitutional: He is oriented to person, place, and time. He appears well-developed and well-nourished.  HENT:  Head: Normocephalic.  Nose: Nose normal.  Mouth/Throat: Oropharynx is clear and moist.  Eyes: Conjunctivae are normal. Pupils are equal, round, and reactive to light.  Neck: Normal range of motion. Neck supple. No JVD present.  Cardiovascular: Normal rate, regular rhythm, S1  normal, S2 normal, normal heart sounds and intact distal pulses.  Exam reveals no gallop and no friction rub.   No murmur heard. Pulmonary/Chest: Effort normal and breath sounds normal. No respiratory distress. He has no wheezes. He has no rales. He exhibits no tenderness.  Abdominal: Soft. Bowel sounds are normal. He exhibits no distension. There is no tenderness.   Musculoskeletal: Normal range of motion. He exhibits no edema or tenderness.  Lymphadenopathy:    He has no cervical adenopathy.  Neurological: He is alert and oriented to person, place, and time. Coordination normal.  Skin: Skin is warm and dry. No rash noted. No erythema.  Psychiatric: He has a normal mood and affect. His behavior is normal. Judgment and thought content normal.      Assessment and Plan   Nursing note and vitals reviewed.

## 2013-12-19 NOTE — Assessment & Plan Note (Signed)
Cholesterol is much lower after his weight loss. We have recommended he cut his Crestor down to 20 minute grams daily

## 2013-12-19 NOTE — Assessment & Plan Note (Signed)
Blood pressures running low. We will hold the lisinopril HCTZ secondary to cough.we will start losartan 50 mg daily. Otherwise doing well

## 2013-12-19 NOTE — Patient Instructions (Addendum)
Your next appointment will be scheduled in our new office located at :  [facility]  [address]  [place], [place] [address]   You are doing well.  Please hold the lisinopril HCTZ Start losartan  one a day If your blood pressure continues to run low, cut teh losartan in 1/2 daily  Please cut the crestor in 1/2 daily  Please call us if you have new issues that need to be addressed before your next appt.  Your physician wants you to follow-up in: 6 months.  You will receive a reminder letter in the mail two months in advance. If you don't receive a letter, please call our office to schedule the follow-up appointment.

## 2014-01-16 ENCOUNTER — Encounter: Payer: Medicare HMO | Admitting: *Deleted

## 2014-01-16 ENCOUNTER — Telehealth: Payer: Self-pay | Admitting: Cardiology

## 2014-01-16 NOTE — Telephone Encounter (Signed)
Pt called and stated that his monitor is being replaced by [employer] b/c of updates.

## 2014-02-07 ENCOUNTER — Telehealth: Payer: Self-pay | Admitting: Cardiovascular Disease

## 2014-02-07 NOTE — Telephone Encounter (Signed)
Attempted to call pt back. No answer and unable to leave a message.  

## 2014-02-07 NOTE — Telephone Encounter (Signed)
New msg     Pt wasn't able to get transmission in due to [employer] issue on 11/30. Pt has since attempted it again.   Wants to know if transmission was received?

## 2014-02-07 NOTE — Telephone Encounter (Signed)
Spoke w/ pt and informed him that we still have not received the transmission. After trying to help pt send a transmission w/ no success. I instructed pt to call tech services b/c I believe his home monitor is not working anymore. Pt verbalized understanding.

## 2014-02-14 ENCOUNTER — Encounter: Payer: Self-pay | Admitting: *Deleted

## 2014-02-23 ENCOUNTER — Ambulatory Visit (INDEPENDENT_AMBULATORY_CARE_PROVIDER_SITE_OTHER): Payer: Medicare HMO | Admitting: *Deleted

## 2014-02-23 DIAGNOSIS — I5022 Chronic systolic (congestive) heart failure: Secondary | ICD-10-CM | POA: Diagnosis not present

## 2014-02-23 DIAGNOSIS — I255 Ischemic cardiomyopathy: Secondary | ICD-10-CM

## 2014-02-23 NOTE — Progress Notes (Signed)
Remote ICD transmission.   

## 2014-02-24 LAB — MDC_IDC_ENUM_SESS_TYPE_REMOTE
Battery Voltage: 2.66 V
Brady Statistic AP VP Percent: 2.15 %
Brady Statistic AP VS Percent: 67.68 %
Brady Statistic AS VP Percent: 0.79 %
Brady Statistic AS VS Percent: 29.38 %
HighPow Impedance: 40 Ohm
HighPow Impedance: 54 Ohm
Lead Channel Impedance Value: 472 Ohm
Lead Channel Impedance Value: 488 Ohm
Lead Channel Sensing Intrinsic Amplitude: 13.2038
Lead Channel Sensing Intrinsic Amplitude: 2.501
Lead Channel Setting Pacing Amplitude: 2 V
Lead Channel Setting Pacing Amplitude: 2 V
Lead Channel Setting Pacing Pulse Width: 0.4 ms
MDC IDC SESS DTM: 20160107215918
MDC IDC SET LEADCHNL RV SENSING SENSITIVITY: 0.6 mV
MDC IDC STAT BRADY RA PERCENT PACED: 69.83 %
MDC IDC STAT BRADY RV PERCENT PACED: 2.94 %
Zone Setting Detection Interval: 290 ms
Zone Setting Detection Interval: 330 ms
Zone Setting Detection Interval: 350 ms
Zone Setting Detection Interval: 450 ms

## 2014-03-18 ENCOUNTER — Other Ambulatory Visit: Payer: Self-pay | Admitting: Cardiovascular Disease

## 2014-03-20 DIAGNOSIS — Z6829 Body mass index (BMI) 29.0-29.9, adult: Secondary | ICD-10-CM | POA: Diagnosis not present

## 2014-03-20 DIAGNOSIS — I10 Essential (primary) hypertension: Secondary | ICD-10-CM | POA: Diagnosis not present

## 2014-03-20 DIAGNOSIS — N529 Male erectile dysfunction, unspecified: Secondary | ICD-10-CM | POA: Diagnosis not present

## 2014-03-20 DIAGNOSIS — I25119 Atherosclerotic heart disease of native coronary artery with unspecified angina pectoris: Secondary | ICD-10-CM | POA: Diagnosis not present

## 2014-03-20 DIAGNOSIS — Z720 Tobacco use: Secondary | ICD-10-CM | POA: Diagnosis not present

## 2014-03-20 DIAGNOSIS — E780 Pure hypercholesterolemia: Secondary | ICD-10-CM | POA: Diagnosis not present

## 2014-03-20 DIAGNOSIS — I739 Peripheral vascular disease, unspecified: Secondary | ICD-10-CM | POA: Diagnosis not present

## 2014-03-20 DIAGNOSIS — I252 Old myocardial infarction: Secondary | ICD-10-CM | POA: Diagnosis not present

## 2014-03-20 DIAGNOSIS — I509 Heart failure, unspecified: Secondary | ICD-10-CM | POA: Diagnosis not present

## 2014-03-21 ENCOUNTER — Other Ambulatory Visit: Payer: Self-pay

## 2014-03-21 MED ORDER — CLOPIDOGREL BISULFATE 75 MG PO TABS: ORAL_TABLET | ORAL | Status: DC

## 2014-03-21 MED ORDER — ROSUVASTATIN CALCIUM 40 MG PO TABS: 40.0000 mg | ORAL_TABLET | Freq: Every day | ORAL | Status: DC

## 2014-03-21 MED ORDER — FUROSEMIDE 40 MG PO TABS: 40.0000 mg | ORAL_TABLET | Freq: Every day | ORAL | Status: DC

## 2014-03-21 MED ORDER — ISOSORBIDE MONONITRATE ER 30 MG PO TB24: ORAL_TABLET | ORAL | Status: DC

## 2014-03-21 MED ORDER — POTASSIUM CHLORIDE CRYS ER 20 MEQ PO TBCR
20.0000 meq | EXTENDED_RELEASE_TABLET | Freq: Every day | ORAL | Status: DC
Start: 2014-03-21 — End: 2015-07-07

## 2014-04-27 ENCOUNTER — Encounter: Payer: Self-pay | Admitting: *Deleted

## 2014-05-02 ENCOUNTER — Ambulatory Visit (INDEPENDENT_AMBULATORY_CARE_PROVIDER_SITE_OTHER): Payer: Commercial Managed Care - HMO | Admitting: Family Medicine

## 2014-05-02 ENCOUNTER — Encounter: Payer: Self-pay | Admitting: Family Medicine

## 2014-05-02 VITALS — BP 144/90 | HR 68 | Temp 98.0°F | Ht 67.75 in | Wt 190.8 lb

## 2014-05-02 DIAGNOSIS — I739 Peripheral vascular disease, unspecified: Secondary | ICD-10-CM | POA: Diagnosis not present

## 2014-05-02 DIAGNOSIS — I5022 Chronic systolic (congestive) heart failure: Secondary | ICD-10-CM

## 2014-05-02 DIAGNOSIS — G4730 Sleep apnea, unspecified: Secondary | ICD-10-CM

## 2014-05-02 DIAGNOSIS — I10 Essential (primary) hypertension: Secondary | ICD-10-CM | POA: Diagnosis not present

## 2014-05-02 DIAGNOSIS — R7303 Prediabetes: Secondary | ICD-10-CM

## 2014-05-02 DIAGNOSIS — I25719 Atherosclerosis of autologous vein coronary artery bypass graft(s) with unspecified angina pectoris: Secondary | ICD-10-CM

## 2014-05-02 DIAGNOSIS — R7309 Other abnormal glucose: Secondary | ICD-10-CM | POA: Diagnosis not present

## 2014-05-02 DIAGNOSIS — E785 Hyperlipidemia, unspecified: Secondary | ICD-10-CM | POA: Diagnosis not present

## 2014-05-02 DIAGNOSIS — Z125 Encounter for screening for malignant neoplasm of prostate: Secondary | ICD-10-CM

## 2014-05-02 NOTE — Assessment & Plan Note (Signed)
Check A1c next labs.  

## 2014-05-02 NOTE — Assessment & Plan Note (Signed)
S/p R iliac artery stent

## 2014-05-02 NOTE — Assessment & Plan Note (Addendum)
Pt denies this dx. 

## 2014-05-02 NOTE — Assessment & Plan Note (Signed)
Chronic, stable. Continue crestor 40mg daily.  

## 2014-05-02 NOTE — Progress Notes (Addendum)
BP 144/90 mmHg  Pulse 68  Temp(Src) 98 F (36.7 C)  Ht 5' 7.75" (1.721 m)  Wt 190 lb 12.8 oz (86.546 kg)  BMI 29.22 kg/m2   CC: new pt to establish  Subjective:    Patient ID: Paul Moore, male    DOB: [birth year], 64 y.o.   MRN: [id number]  HPI: Paul Moore is a 64 y.o. male presenting on 05/02/2014 for Establish Care   Prior saw [facility] primary care at [facility].  Sees Dr Mariah Milling and Dr Alla German.   CAD with ischemic cardiomyopathy. First MI 64 yo. S/p stent 2000 then 5v CABG 2008 - stable on current regimen. No dyspnea on exertion or chest pain.   HTN - Compliant with current antihypertensive regimen of lasix, imdur and losartan 50mg  daily.  Does check blood pressures at home: well controlled.  No low blood pressure readings or symptoms of dizziness/syncope.  Denies HA, vision changes, CP/tightness, SOB, leg swelling.   HLD - compliant with crestor 40mg  nightly without myalgias.   Some calf pain but no claudication sxs.   Ex smoker.   Preventative: Last CPE unsure. Last labs 09/2013 Flu 11/2013  Lives with wife Occupation: disability from cardiac status Activity: walks 2-3 miles daily, 5 mi on stationary bike, wii workoup Diet: good water, fruits/vegetables daily  Relevant past medical, surgical, family and social history reviewed and updated as indicated. Interim medical history since our last visit reviewed. Allergies and medications reviewed and updated. Current Outpatient Prescriptions on File Prior to Visit  Medication Sig  . aspirin 81 MG tablet Take 1 tablet (81 mg total) by mouth daily.  . clopidogrel (PLAVIX) 75 MG tablet TAKE ONE TABLET BY MOUTH EVERY DAY  . furosemide (LASIX) 40 MG tablet Take 1 tablet (40 mg total) by mouth daily.  . isosorbide mononitrate (IMDUR) 30 MG 24 hr tablet TAKE TWO TABLETS (60 MG) BY MOUTH EVERY DAY AT BEDTIME  . losartan (COZAAR) 50 MG tablet Take 1 tablet (50 mg total) by mouth daily.  . potassium chloride SA (K-DUR,KLOR-CON) 20  MEQ tablet Take 1 tablet (20 mEq total) by mouth daily.  . rosuvastatin (CRESTOR) 40 MG tablet Take 1 tablet (40 mg total) by mouth daily.   No current facility-administered medications on file prior to visit.    Review of Systems Per HPI unless specifically indicated above     Objective:    BP 144/90 mmHg  Pulse 68  Temp(Src) 98 F (36.7 C)  Ht 5' 7.75" (1.721 m)  Wt 190 lb 12.8 oz (86.546 kg)  BMI 29.22 kg/m2  Wt Readings from Last 3 Encounters:  05/02/14 190 lb 12.8 oz (86.546 kg)  12/19/13 184 lb 12 oz (83.802 kg)  10/11/13 186 lb 8 oz (84.596 kg)    Physical Exam  Constitutional: He appears well-developed and well-nourished. No distress.  HENT:  Mouth/Throat: Oropharynx is clear and moist. No oropharyngeal exudate.  Eyes: Conjunctivae and EOM are normal. Pupils are equal, round, and reactive to light. No scleral icterus.  Neck: Normal range of motion. Neck supple. No thyromegaly present.  Cardiovascular: Normal rate, regular rhythm, normal heart sounds and intact distal pulses.   No murmur heard. Pulmonary/Chest: Effort normal and breath sounds normal. No respiratory distress. He has no wheezes. He has no rales.  Musculoskeletal: Normal range of motion. He exhibits no edema.  Lymphadenopathy:    He has no cervical adenopathy.  Skin: Skin is warm and dry. No rash noted.  Psychiatric: He has a  normal mood and affect.  Nursing note and vitals reviewed.     Assessment & Plan:   Problem List Items Addressed This Visit    SYSTOLIC HEART FAILURE, CHRONIC    Continue lasix. Denies dyspnea or pedal edema.      Sleep apnea    Pt denies this dx.      PVD (peripheral vascular disease)    S/p R iliac artery stent      Prediabetes    Check A1c next labs.      Relevant Orders   Hemoglobin A1c   Hyperlipidemia    Chronic, stable. Continue crestor  daily.      Relevant Orders   Lipid panel   Comprehensive metabolic panel   Essential hypertension - Primary      Chronic, stable. Continue current regimen.      Relevant Orders   Comprehensive metabolic panel   TSH   Microalbumin / creatinine urine ratio   CORONARY ATHEROSCLEROSIS, AUTOLOGOUS VEIN BYPASS GRAFT    Asymptomatic.        Other Visit Diagnoses    Special screening for malignant neoplasm of prostate        Relevant Orders    PSA, Medicare        Follow up plan: Return as needed, for medicare wellness.

## 2014-05-02 NOTE — Assessment & Plan Note (Addendum)
Chronic, stable. Continue current regimen. 

## 2014-05-02 NOTE — Assessment & Plan Note (Signed)
Asymptomatic. 

## 2014-05-02 NOTE — Patient Instructions (Addendum)
Return at your convenience for fasting labs  Let ladies up front know.  Good to meet you today, call us with questions.  Return for wellness visit in next few months.

## 2014-05-02 NOTE — Assessment & Plan Note (Signed)
Continue lasix. Denies dyspnea or pedal edema.

## 2014-05-02 NOTE — Progress Notes (Signed)
Pre visit review using our clinic review tool, if applicable. No additional management support is needed unless otherwise documented below in the visit note. 

## 2014-05-03 ENCOUNTER — Other Ambulatory Visit: Payer: Medicare HMO

## 2014-05-03 ENCOUNTER — Telehealth: Payer: Self-pay | Admitting: Family Medicine

## 2014-05-03 NOTE — Telephone Encounter (Signed)
emmi emailed °

## 2014-05-04 ENCOUNTER — Encounter: Payer: Self-pay | Admitting: Internal Medicine

## 2014-05-08 ENCOUNTER — Other Ambulatory Visit (INDEPENDENT_AMBULATORY_CARE_PROVIDER_SITE_OTHER): Payer: Commercial Managed Care - HMO

## 2014-05-08 DIAGNOSIS — R7303 Prediabetes: Secondary | ICD-10-CM

## 2014-05-08 DIAGNOSIS — R7309 Other abnormal glucose: Secondary | ICD-10-CM | POA: Diagnosis not present

## 2014-05-08 DIAGNOSIS — I10 Essential (primary) hypertension: Secondary | ICD-10-CM | POA: Diagnosis not present

## 2014-05-08 DIAGNOSIS — Z125 Encounter for screening for malignant neoplasm of prostate: Secondary | ICD-10-CM

## 2014-05-08 DIAGNOSIS — E785 Hyperlipidemia, unspecified: Secondary | ICD-10-CM

## 2014-05-08 LAB — COMPREHENSIVE METABOLIC PANEL
ALBUMIN: 4.1 g/dL (ref 3.5–5.2)
ALK PHOS: 80 U/L (ref 39–117)
ALT: 14 U/L (ref 0–53)
AST: 19 U/L (ref 0–37)
BUN: 15 mg/dL (ref 6–23)
CO2: 28 mEq/L (ref 19–32)
Calcium: 9 mg/dL (ref 8.4–10.5)
Chloride: 106 mEq/L (ref 96–112)
Creatinine, Ser: 1.06 mg/dL (ref 0.40–1.50)
GFR: 75.4 mL/min (ref >60.00)
Glucose, Bld: 114 mg/dL — ABNORMAL HIGH (ref 70–99)
POTASSIUM: 3.7 meq/L (ref 3.5–5.1)
Sodium: 138 mEq/L (ref 135–145)
Total Bilirubin: 0.8 mg/dL (ref 0.2–1.2)
Total Protein: 6.7 g/dL (ref 6.0–8.3)

## 2014-05-08 LAB — LIPID PANEL
CHOL/HDL RATIO: 3
Cholesterol: 109 mg/dL (ref 0–200)
HDL: 39.3 mg/dL (ref >39.00)
LDL CALC: 52 mg/dL (ref 0–99)
NonHDL: 69.7
TRIGLYCERIDES: 89 mg/dL (ref 0.0–149.0)
VLDL: 17.8 mg/dL (ref 0.0–40.0)

## 2014-05-08 LAB — MICROALBUMIN / CREATININE URINE RATIO
CREATININE, U: 248 mg/dL
MICROALB/CREAT RATIO: 0.4 mg/g (ref 0.0–30.0)
Microalb, Ur: 1 mg/dL (ref 0.0–1.9)

## 2014-05-08 LAB — PSA, MEDICARE: PSA: 1.18 ng/ml (ref 0.10–4.00)

## 2014-05-08 LAB — HEMOGLOBIN A1C: HEMOGLOBIN A1C: 6.7 % — AB (ref 4.6–6.5)

## 2014-05-08 LAB — TSH: TSH: 1.08 u[IU]/mL (ref 0.35–4.50)

## 2014-05-09 ENCOUNTER — Encounter: Payer: Self-pay | Admitting: Family Medicine

## 2014-05-09 DIAGNOSIS — E118 Type 2 diabetes mellitus with unspecified complications: Secondary | ICD-10-CM | POA: Insufficient documentation

## 2014-05-29 ENCOUNTER — Ambulatory Visit (INDEPENDENT_AMBULATORY_CARE_PROVIDER_SITE_OTHER): Payer: Commercial Managed Care - HMO | Admitting: *Deleted

## 2014-05-29 DIAGNOSIS — I5022 Chronic systolic (congestive) heart failure: Secondary | ICD-10-CM

## 2014-05-29 DIAGNOSIS — I255 Ischemic cardiomyopathy: Secondary | ICD-10-CM

## 2014-05-29 LAB — MDC_IDC_ENUM_SESS_TYPE_REMOTE
Battery Voltage: 2.64 V
Brady Statistic AP VP Percent: 0.55 %
Brady Statistic AS VP Percent: 0.19 %
Brady Statistic AS VS Percent: 43.96 %
Brady Statistic RA Percent Paced: 55.85 %
Brady Statistic RV Percent Paced: 0.74 %
Date Time Interrogation Session: 20160411140757
HIGH POWER IMPEDANCE MEASURED VALUE: 63 Ohm
HighPow Impedance: 46 Ohm
Lead Channel Impedance Value: 512 Ohm
Lead Channel Impedance Value: 568 Ohm
Lead Channel Sensing Intrinsic Amplitude: 14.22 mV
Lead Channel Setting Sensing Sensitivity: 0.6 mV
MDC IDC MSMT LEADCHNL RA SENSING INTR AMPL: 3.363 mV
MDC IDC SET LEADCHNL RA PACING AMPLITUDE: 2 V
MDC IDC SET LEADCHNL RV PACING AMPLITUDE: 2 V
MDC IDC SET LEADCHNL RV PACING PULSEWIDTH: 0.4 ms
MDC IDC SET ZONE DETECTION INTERVAL: 290 ms
MDC IDC SET ZONE DETECTION INTERVAL: 330 ms
MDC IDC SET ZONE DETECTION INTERVAL: 350 ms
MDC IDC STAT BRADY AP VS PERCENT: 55.3 %
Zone Setting Detection Interval: 450 ms

## 2014-05-29 NOTE — Progress Notes (Signed)
Remote ICD transmission.   

## 2014-06-16 ENCOUNTER — Encounter: Payer: Self-pay | Admitting: Cardiovascular Disease

## 2014-06-16 ENCOUNTER — Ambulatory Visit (INDEPENDENT_AMBULATORY_CARE_PROVIDER_SITE_OTHER): Payer: Commercial Managed Care - HMO | Admitting: Cardiovascular Disease

## 2014-06-16 VITALS — BP 122/80 | HR 66 | Ht 67.0 in | Wt 189.8 lb

## 2014-06-16 DIAGNOSIS — I10 Essential (primary) hypertension: Secondary | ICD-10-CM | POA: Diagnosis not present

## 2014-06-16 DIAGNOSIS — E785 Hyperlipidemia, unspecified: Secondary | ICD-10-CM

## 2014-06-16 DIAGNOSIS — I25719 Atherosclerosis of autologous vein coronary artery bypass graft(s) with unspecified angina pectoris: Secondary | ICD-10-CM

## 2014-06-16 DIAGNOSIS — I739 Peripheral vascular disease, unspecified: Secondary | ICD-10-CM | POA: Diagnosis not present

## 2014-06-16 DIAGNOSIS — E118 Type 2 diabetes mellitus with unspecified complications: Secondary | ICD-10-CM

## 2014-06-16 DIAGNOSIS — Z95810 Presence of automatic (implantable) cardiac defibrillator: Secondary | ICD-10-CM

## 2014-06-16 NOTE — Assessment & Plan Note (Signed)
Currently with no symptoms of angina. No further workup at this time. Continue current medication regimen. 

## 2014-06-16 NOTE — Assessment & Plan Note (Signed)
We have encouraged continued exercise, careful diet management in an effort to lose weight. 

## 2014-06-16 NOTE — Patient Instructions (Signed)
You are doing well. No medication changes were made.  Please call us if you have new issues that need to be addressed before your next appt.  Your physician wants you to follow-up in: 6 months.  You will receive a reminder letter in the mail two months in advance. If you don't receive a letter, please call our office to schedule the follow-up appointment.   

## 2014-06-16 NOTE — Assessment & Plan Note (Signed)
Cholesterol is at goal on the current lipid regimen. No changes to the medications were made.  

## 2014-06-16 NOTE — Assessment & Plan Note (Signed)
Blood pressure is well controlled on today's visit. No changes made to the medications. 

## 2014-06-16 NOTE — Assessment & Plan Note (Signed)
Followed by Dr. Klein 

## 2014-06-16 NOTE — Progress Notes (Signed)
Patient ID: Paul Moore, male    DOB: [birth year], 64 y.o.   MRN: [id number]  HPI Comments: Paul Moore is a 64 year old gentleman with a history of smoking for 35 years, coronary artery disease, bypass x5 in July 2008, catheterization in December 2008 for recurrent chest pain with stents to his native RCA, occluded vein graft to the PDA, 60-70% proximal vein graft disease to the diagonal,  s/p ICD implantation for primary prevention, presents for routine followup of his coronary artery disease   Prior anginal symptoms included a sharp pain in his back.     Paul Moore is doing well. As on his prior clinic visit, He is exercising on a bike daily.  Weight is stable, blood pressure well controlled. Recently helped move his daughter into a new house, lifting heavy furniture No significant lightheadedness with standing No significant chest pain concerning for angina. He is lifting weights at the gym Chronic cough resolved by changing to losartan His wife has problems with Mnire's disease. She is incapacitated by her symptoms.  EKG on today's visit shows normal sinus rhythm with rate 66 bpm, T-wave abnormality anterolateral leads  Other past medical history echocardiogram in 2009 shows ejection fraction 20-30%, mildly dilated left ventricle, moderately enlarged right ventricle with moderately decreased systolic function.   Last stress test in November 2008, treadmill where he exercised only for 3 minutes, 5 minutes. The large region of decreased perfusion in the inferior and lateral walls no ischemia, ejection fraction 22%.   Last catheterization December 2008 with a PTCA and stenting of the RCA, Promus stent 3.0 x 28 mm. occluded vein graft to the RCA at the ostium, severe diffuse native RCA disease with 90% proximal, 90% mid, high-grade PDA narrowing in a small vessel, moderate stenosis at the ostium of the vein graft to the diagonal, patent LIMA and patent vein graft to the circumflex  Total  cholesterol in September 2014 was 148, LDL 84, glucose 127 Most recent cholesterol 109, LDL 53     Allergies  Allergen Reactions  . Amiodarone     REACTION: Hives  . Beta Adrenergic Blockers Other (See Comments)    Fatigue, weakness, diarrhea    Outpatient Encounter Prescriptions as of 06/16/2014  Medication Sig  . aspirin 81 MG tablet Take 1 tablet (81 mg total) by mouth daily.  . clopidogrel (PLAVIX) 75 MG tablet TAKE ONE TABLET BY MOUTH EVERY DAY  . furosemide (LASIX) 40 MG tablet Take 1 tablet (40 mg total) by mouth daily.  . isosorbide mononitrate (IMDUR) 30 MG 24 hr tablet TAKE TWO TABLETS (60 MG) BY MOUTH EVERY DAY AT BEDTIME  . losartan (COZAAR) 50 MG tablet Take 1 tablet (50 mg total) by mouth daily.  . potassium chloride SA (K-DUR,KLOR-CON) 20 MEQ tablet Take 1 tablet (20 mEq total) by mouth daily.  . rosuvastatin (CRESTOR) 40 MG tablet Take 1 tablet (40 mg total) by mouth daily.    Past Medical History  Diagnosis Date  . Hypertension   . PVD (peripheral vascular disease)     iliac stenosis  . Ischemic cardiomyopathy   . Dyslipidemia   . Coronary artery disease 47    MI age 53 yo s/p PCI  . Beta-blocker intolerance        . 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 Ex-smoker   . Diabetes mellitus type 2, controlled, with complications     CAD    Past Surgical History  Procedure Laterality Date  . Coronary artery bypass graft  2008    5v ([facility])  . Iliac artery stent Right     [facility]  . Cardiac defibrillator placement  2009    ICD in place [name])  . Percutaneous coronary stent intervention (pci-s)  2000  . Incision and drainage perirectal abscess      Social History  reports that he quit smoking about 8 years ago. His smoking use included Cigarettes. He smoked 1.00 pack per day. He has never used smokeless tobacco. He reports that he drinks alcohol. He reports that he does not use illicit drugs.  Family History family history includes CAD (age of  onset: 1) in his father; CAD (age of onset: 70) in his mother; Stroke in his mother. There is no history of Hypertension, Diabetes, or Cancer.   Review of Systems  Constitutional: Negative.   Respiratory: Negative.   Cardiovascular: Negative.   Gastrointestinal: Negative.   Musculoskeletal: Negative.   Skin: Negative.   Neurological: Negative.   Hematological: Negative.   Psychiatric/Behavioral: Negative.   All other systems reviewed and are negative.   BP 122/80 mmHg  Pulse 66  Ht  (1.702 m)  Wt 189 lb 12 oz (86.07 kg)  BMI 29.71 kg/m2  Physical Exam  Constitutional: He is oriented to person, place, and time. He appears well-developed and well-nourished.  HENT:  Head: Normocephalic.  Nose: Nose normal.  Mouth/Throat: Oropharynx is clear and moist.  Eyes: Conjunctivae are normal. Pupils are equal, round, and reactive to light.  Neck: Normal range of motion. Neck supple. No JVD present.  Cardiovascular: Normal rate, regular rhythm, S1 normal, S2 normal, normal heart sounds and intact distal pulses.  Exam reveals no gallop and no friction rub.   No murmur heard. Pulmonary/Chest: Effort normal and breath sounds normal. No respiratory distress. He has no wheezes. He has no rales. He exhibits no tenderness.  Abdominal: Soft. Bowel sounds are normal. He exhibits no distension. There is no tenderness.  Musculoskeletal: Normal range of motion. He exhibits no edema or tenderness.  Lymphadenopathy:    He has no cervical adenopathy.  Neurological: He is alert and oriented to person, place, and time. Coordination normal.  Skin: Skin is warm and dry. No rash noted. No erythema.  Psychiatric: He has a normal mood and affect. His behavior is normal. Judgment and thought content normal.      Assessment and Plan   Nursing note and vitals reviewed.

## 2014-06-16 NOTE — Assessment & Plan Note (Signed)
.   Prior stenting to his right iliac vessel. Good extremity pulses  Prior ABIs several years ago were normal. Currently with no claudication symptoms

## 2014-06-27 ENCOUNTER — Encounter: Payer: Self-pay | Admitting: Cardiology

## 2014-06-29 ENCOUNTER — Encounter: Payer: Self-pay | Admitting: Internal Medicine

## 2014-08-29 ENCOUNTER — Ambulatory Visit (INDEPENDENT_AMBULATORY_CARE_PROVIDER_SITE_OTHER): Payer: Commercial Managed Care - HMO | Admitting: *Deleted

## 2014-08-29 ENCOUNTER — Encounter: Payer: Self-pay | Admitting: Internal Medicine

## 2014-08-29 DIAGNOSIS — I5022 Chronic systolic (congestive) heart failure: Secondary | ICD-10-CM | POA: Diagnosis not present

## 2014-08-29 DIAGNOSIS — I255 Ischemic cardiomyopathy: Secondary | ICD-10-CM

## 2014-08-29 NOTE — Progress Notes (Signed)
Remote ICD transmission.   

## 2014-09-01 LAB — CUP PACEART REMOTE DEVICE CHECK
Battery Voltage: 2.62 V
Brady Statistic AP VP Percent: 1.69 %
Brady Statistic AP VS Percent: 64.62 %
Brady Statistic AS VS Percent: 33.27 %
Date Time Interrogation Session: 20160712121243
HIGH POWER IMPEDANCE MEASURED VALUE: 41 Ohm
HIGH POWER IMPEDANCE MEASURED VALUE: 54 Ohm
Lead Channel Impedance Value: 552 Ohm
Lead Channel Sensing Intrinsic Amplitude: 3.277 mV
Lead Channel Setting Pacing Amplitude: 2 V
Lead Channel Setting Sensing Sensitivity: 0.6 mV
MDC IDC MSMT LEADCHNL RV IMPEDANCE VALUE: 472 Ohm
MDC IDC MSMT LEADCHNL RV SENSING INTR AMPL: 12.865 mV
MDC IDC SET LEADCHNL RV PACING AMPLITUDE: 2 V
MDC IDC SET LEADCHNL RV PACING PULSEWIDTH: 0.4 ms
MDC IDC SET ZONE DETECTION INTERVAL: 450 ms
MDC IDC STAT BRADY AS VP PERCENT: 0.43 %
MDC IDC STAT BRADY RA PERCENT PACED: 66.31 %
MDC IDC STAT BRADY RV PERCENT PACED: 2.12 %
Zone Setting Detection Interval: 290 ms
Zone Setting Detection Interval: 330 ms
Zone Setting Detection Interval: 350 ms

## 2014-09-22 ENCOUNTER — Encounter: Payer: Self-pay | Admitting: Cardiology

## 2014-09-27 ENCOUNTER — Encounter: Payer: Self-pay | Admitting: Family Medicine

## 2014-09-27 DIAGNOSIS — N529 Male erectile dysfunction, unspecified: Secondary | ICD-10-CM | POA: Insufficient documentation

## 2014-10-04 ENCOUNTER — Encounter: Payer: Self-pay | Admitting: *Deleted

## 2014-10-09 ENCOUNTER — Telehealth: Payer: Self-pay | Admitting: Cardiology

## 2014-10-09 NOTE — Telephone Encounter (Signed)
Pt called and stated that his ICD alarm went off. Instructed pt to send manual transmission w/ home monitor. Pt agreed w/ this plan.

## 2014-10-09 NOTE — Telephone Encounter (Signed)
Spoke w/ Librarian, academic at facility and informed her that pt reached RRT. Pt already as APPT w/ MD on 10-17-14.

## 2014-10-17 ENCOUNTER — Encounter: Payer: Self-pay | Admitting: Internal Medicine

## 2014-10-17 ENCOUNTER — Ambulatory Visit (INDEPENDENT_AMBULATORY_CARE_PROVIDER_SITE_OTHER): Payer: Commercial Managed Care - HMO | Admitting: Internal Medicine

## 2014-10-17 VITALS — BP 122/72 | HR 65 | Ht 67.0 in | Wt 186.5 lb

## 2014-10-17 DIAGNOSIS — I25719 Atherosclerosis of autologous vein coronary artery bypass graft(s) with unspecified angina pectoris: Secondary | ICD-10-CM

## 2014-10-17 DIAGNOSIS — I10 Essential (primary) hypertension: Secondary | ICD-10-CM

## 2014-10-17 DIAGNOSIS — I5022 Chronic systolic (congestive) heart failure: Secondary | ICD-10-CM

## 2014-10-17 NOTE — Patient Instructions (Signed)
Medication Instructions:  Your physician recommends that you continue on your current medications as directed. Please refer to the Current Medication list given to you today.   Labwork: none  Testing/Procedures: none  Follow-Up:    Any Other Special Instructions Will Be Listed Below (If Applicable). Good luck to you in [place]! We will miss you!

## 2014-10-17 NOTE — Progress Notes (Signed)
Patient Care Team: Eustaquio Boyden, MD as PCP - General (Family Medicine)   HPI  Paul Moore is a 64 y.o. male Seen in followup for ICD implanted for primary prevention in the setting of coronary artery disease, bypass x5 in July 2009;   The patient denies chest pain, shortness of breath, nocturnal dyspnea, orthopnea or peripheral edema. There have been no palpitations, lightheadedness or syncope.   He is seeing Dr. Knute Neu the last few months.  Echocardiogram in 2011 demonstrated LV dysfunction EF 30-35% with inferolateral septal and apical wall motion abnormalities.  Last stress test in November 2008, treadmill where he exercised only for 3 minutes, 5 minutes. The large region of decreased perfusion in the inferior and lateral walls no ischemia, ejection fraction 22%.  Last catheterization December 2008 with a PTCA and stenting of the RCA, Promus stent 3.0 x 28 mm. occluded vein graft to the RCA at the ostium, severe diffuse native RCA disease with 90% proximal, 90% mid, high-grade PDA narrowing in a small vessel, moderate stenosis at the ostium of the vein graft to the diagonal, patent LIMA and patent vein graft to the circumflex   His wife is incapacitated by Mnire's disease   Past Medical History  Diagnosis Date  . Hypertension   . PVD (peripheral vascular disease)     iliac stenosis  . Ischemic cardiomyopathy   . Dyslipidemia   . Coronary artery disease 10    MI age 69 yo s/p PCI  . Beta-blocker intolerance        . 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 Ex-smoker   . Diabetes mellitus type 2, controlled, with complications     CAD  . Erectile dysfunction     failed PDE5 inhibitors, good results with pump    Past Surgical History  Procedure Laterality Date  . Coronary artery bypass graft  2008    5v ([facility])  . Iliac artery stent Right     [facility]  . Cardiac defibrillator placement  2009    ICD in place [name])  . Percutaneous coronary stent intervention  (pci-s)  2000  . Incision and drainage perirectal abscess      Current Outpatient Prescriptions  Medication Sig Dispense Refill  . aspirin 81 MG tablet Take 1 tablet (81 mg total) by mouth daily. 30 tablet 0  . clopidogrel (PLAVIX) 75 MG tablet TAKE ONE TABLET BY MOUTH EVERY DAY 90 tablet 3  . furosemide (LASIX) 40 MG tablet Take 1 tablet (40 mg total) by mouth daily. 90 tablet 3  . isosorbide mononitrate (IMDUR) 30 MG 24 hr tablet TAKE TWO TABLETS (60 MG) BY MOUTH EVERY DAY AT BEDTIME 180 tablet 3  . losartan (COZAAR) 50 MG tablet Take 1 tablet (50 mg total) by mouth daily. 90 tablet 3  . potassium chloride SA (K-DUR,KLOR-CON) 20 MEQ tablet Take 1 tablet (20 mEq total) by mouth daily. 90 tablet 3  . rosuvastatin (CRESTOR) 40 MG tablet Take 1 tablet (40 mg total) by mouth daily. 90 tablet 3   No current facility-administered medications for this visit.    Allergies  Allergen Reactions  . Amiodarone     REACTION: Hives  . Beta Adrenergic Blockers Other (See Comments)    Fatigue, weakness, diarrhea    Review of Systems negative except from HPI and PMH  Physical Exam BP 122/72 mmHg  Pulse 65  Ht  (1.702 m)  Wt 186 lb 8 oz (84.596 kg)  BMI 29.20 kg/m2 Well developed and well nourished in  no acute distress HENT normal E scleral and icterus clear Neck Supple JVP flat; carotids brisk and full Clear to ausculation Device pocket well healed; without hematoma or erythema.  There is no tethering Regular rate and rhythm, no murmurs gallops or rub Soft with active bowel sounds No clubbing cyanosis none Edema Alert and oriented, grossly normal motor and sensory function Skin Warm and Dry  ECG demonstrates sinus rhythm with intermittent atrial pacing Intervals 22/09/39  Assessment and  Plan  Sinus node dysfunction  Ischemic cardiomyopathy  Congestive heart failure-chronic-systolic-class II  Implantable defibrillator-Medtronic dual chamber  The patient's device was  interrogated.  The information was reviewed. No changes were made in the programming.    He is euvolemic; continue current medications  Without symptoms of ischemia   No interval ischemia  Exercise tolerance is adequate with rate response programming

## 2014-11-10 ENCOUNTER — Encounter: Payer: Self-pay | Admitting: Internal Medicine

## 2014-11-13 ENCOUNTER — Ambulatory Visit (INDEPENDENT_AMBULATORY_CARE_PROVIDER_SITE_OTHER): Payer: Commercial Managed Care - HMO | Admitting: Cardiovascular Disease

## 2014-11-13 ENCOUNTER — Encounter: Payer: Self-pay | Admitting: Cardiovascular Disease

## 2014-11-13 VITALS — BP 124/72 | HR 65 | Ht 67.0 in | Wt 179.5 lb

## 2014-11-13 DIAGNOSIS — E785 Hyperlipidemia, unspecified: Secondary | ICD-10-CM | POA: Diagnosis not present

## 2014-11-13 DIAGNOSIS — I10 Essential (primary) hypertension: Secondary | ICD-10-CM

## 2014-11-13 DIAGNOSIS — I5022 Chronic systolic (congestive) heart failure: Secondary | ICD-10-CM

## 2014-11-13 DIAGNOSIS — I255 Ischemic cardiomyopathy: Secondary | ICD-10-CM

## 2014-11-13 DIAGNOSIS — I25719 Atherosclerosis of autologous vein coronary artery bypass graft(s) with unspecified angina pectoris: Secondary | ICD-10-CM | POA: Diagnosis not present

## 2014-11-13 DIAGNOSIS — E118 Type 2 diabetes mellitus with unspecified complications: Secondary | ICD-10-CM

## 2014-11-13 DIAGNOSIS — Z95810 Presence of automatic (implantable) cardiac defibrillator: Secondary | ICD-10-CM | POA: Diagnosis not present

## 2014-11-13 MED ORDER — LOSARTAN POTASSIUM 50 MG PO TABS: 50.0000 mg | ORAL_TABLET | Freq: Every day | ORAL | Status: DC

## 2014-11-13 NOTE — Assessment & Plan Note (Signed)
Exercises daily, improved diet with weight loss over the past several years. Hemoglobin A1c and diabetes management by primary care

## 2014-11-13 NOTE — Assessment & Plan Note (Signed)
Currently with no symptoms of angina. No further workup at this time. Continue current medication regimen. 

## 2014-11-13 NOTE — Assessment & Plan Note (Signed)
He appears relatively euvolemic on today's visit. Previously intolerant of beta blockers. No medication changes made

## 2014-11-13 NOTE — Assessment & Plan Note (Signed)
No recent echocardiogram. This was discussed with him. In 2011, ejection fraction up to 30-35% from 25%. If he was staying in [place], would get a new baseline echocardiogram. As he is moving, this could be done in [place].

## 2014-11-13 NOTE — Assessment & Plan Note (Signed)
Notes indicating he will need close follow-up with his device for battery change out. So far has had no significant problems, no arrhythmia requiring shock

## 2014-11-13 NOTE — Assessment & Plan Note (Signed)
Cholesterol is aggressively low. He would like to keep it this way Currently on Crestor 20 mg daily (cuts a 40 mg pill in half)

## 2014-11-13 NOTE — Progress Notes (Signed)
Patient ID: Paul Moore, male    DOB: [birth year], 64 y.o.   MRN: [id number]  HPI Comments: Paul Moore is a 64 year old gentleman with a history of smoking for 35 years, coronary artery disease, bypass x5 in July 2008, catheterization in December 2008 for recurrent chest pain with stents to his native RCA, occluded vein graft to the PDA, 60-70% proximal vein graft disease to the diagonal,  s/p ICD implantation for primary prevention, presents for routine followup of his coronary artery disease   Prior anginal symptoms included a sharp pain in his back.   In follow-up, he reports that he is doing well. He is moving to be near family in [place], [place] scheduled to leave next month Denies any symptoms concerning for angina. Active, exercises daily, walks 3 miles Significant weight loss over the past several years through diet changes and exercise Most recent lab work showing cholesterol of 109, LDL of 50. Hemoglobin A1c 6.5 In general he is very happy, no complaints  EKG on today's visit shows nsr 65 bpm, Twave inversion V6 and I and AVL   Other past medical history Chronic cough resolved by changing to losartan His wife has problems with Mnire's disease. She is incapacitated by her symptoms.  echocardiogram in 2009 shows ejection fraction 20-30%, mildly dilated left ventricle, moderately enlarged right ventricle with moderately decreased systolic function.   Last stress test in November 2008, treadmill where he exercised only for 3 minutes, 5 minutes. The large region of decreased perfusion in the inferior and lateral walls no ischemia, ejection fraction 22%.   Last catheterization December 2008 with a PTCA and stenting of the RCA, Promus stent 3.0 x 28 mm. occluded vein graft to the RCA at the ostium, severe diffuse native RCA disease with 90% proximal, 90% mid, high-grade PDA narrowing in a small vessel, moderate stenosis at the ostium of the vein graft to the diagonal, patent LIMA  and patent vein graft to the circumflex  Total cholesterol in September 2014 was 148, LDL 84, glucose 127 Most recent cholesterol 109, LDL 53     Allergies  Allergen Reactions  . Amiodarone     REACTION: Hives  . Beta Adrenergic Blockers Other (See Comments)    Fatigue, weakness, diarrhea    Outpatient Encounter Prescriptions as of 11/13/2014  Medication Sig  . aspirin 81 MG tablet Take 1 tablet (81 mg total) by mouth daily.  . clopidogrel (PLAVIX) 75 MG tablet TAKE ONE TABLET BY MOUTH EVERY DAY  . furosemide (LASIX) 40 MG tablet Take 1 tablet (40 mg total) by mouth daily.  . isosorbide mononitrate (IMDUR) 30 MG 24 hr tablet TAKE TWO TABLETS (60 MG) BY MOUTH EVERY DAY AT BEDTIME  . losartan (COZAAR) 50 MG tablet Take 1 tablet (50 mg total) by mouth daily.  . potassium chloride SA (K-DUR,KLOR-CON) 20 MEQ tablet Take 1 tablet (20 mEq total) by mouth daily.  . rosuvastatin (CRESTOR) 40 MG tablet Take 1 tablet (40 mg total) by mouth daily.  . [DISCONTINUED] losartan (COZAAR) 50 MG tablet Take 1 tablet (50 mg total) by mouth daily.   No facility-administered encounter medications on file as of 11/13/2014.    Past Medical History  Diagnosis Date  . Hypertension   . PVD (peripheral vascular disease)     iliac stenosis  . Ischemic cardiomyopathy   . Dyslipidemia   . Coronary artery disease 35    MI age 89 yo s/p PCI  . Beta-blocker intolerance        .  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 Ex-smoker   . Diabetes mellitus type 2, controlled, with complications     CAD  . Erectile dysfunction     failed PDE5 inhibitors, good results with pump    Past Surgical History  Procedure Laterality Date  . Coronary artery bypass graft  2008    5v ([facility])  . Iliac artery stent Right     [facility]  . Cardiac defibrillator placement  2009    ICD in place [name])  . Percutaneous coronary stent intervention (pci-s)  2000  . Incision and drainage perirectal abscess      Social  History  reports that he quit smoking about 8 years ago. His smoking use included Cigarettes. He smoked 1.00 pack per day. He has never used smokeless tobacco. He reports that he drinks alcohol. He reports that he does not use illicit drugs.  Family History family history includes CAD (age of onset: 44) in his father; CAD (age of onset: 72) in his mother; Diabetes in his paternal grandfather; Stroke in his mother. There is no history of Hypertension or Cancer.   Review of Systems  Constitutional: Negative.   Respiratory: Negative.   Cardiovascular: Negative.   Gastrointestinal: Negative.   Musculoskeletal: Negative.   Skin: Negative.   Neurological: Negative.   Hematological: Negative.   Psychiatric/Behavioral: Negative.   All other systems reviewed and are negative.   BP 124/72 mmHg  Pulse 65  Ht 5\' 7"  (1.702 m)  Wt 179 lb 8 oz (81.421 kg)  BMI 28.11 kg/m2  SpO2 98%  Physical Exam  Constitutional: He is oriented to person, place, and time. He appears well-developed and well-nourished.  HENT:  Head: Normocephalic.  Nose: Nose normal.  Mouth/Throat: Oropharynx is clear and moist.  Eyes: Conjunctivae are normal. Pupils are equal, round, and reactive to light.  Neck: Normal range of motion. Neck supple. No JVD present.  Cardiovascular: Normal rate, regular rhythm, S1 normal, S2 normal, normal heart sounds and intact distal pulses.  Exam reveals no gallop and no friction rub.   No murmur heard. Pulmonary/Chest: Effort normal and breath sounds normal. No respiratory distress. He has no wheezes. He has no rales. He exhibits no tenderness.  Abdominal: Soft. Bowel sounds are normal. He exhibits no distension. There is no tenderness.  Musculoskeletal: Normal range of motion. He exhibits no edema or tenderness.  Lymphadenopathy:    He has no cervical adenopathy.  Neurological: He is alert and oriented to person, place, and time. Coordination normal.  Skin: Skin is warm and dry. No  rash noted. No erythema.  Psychiatric: He has a normal mood and affect. His behavior is normal. Judgment and thought content normal.      Assessment and Plan   Nursing note and vitals reviewed.

## 2014-11-13 NOTE — Assessment & Plan Note (Signed)
He currently takes isosorbide 30 mg daily. Rarely has to take additional dose as he has no angina Blood pressure is well controlled on today's visit. No changes made to the medications.

## 2014-11-13 NOTE — Patient Instructions (Signed)
You are doing well. No medication changes were made.  Please call us if you have new issues that need to be addressed before your next appt.    

## 2014-11-29 ENCOUNTER — Telehealth: Payer: Self-pay | Admitting: *Deleted

## 2014-11-29 NOTE — Telephone Encounter (Signed)
Patient called and they decided not to move back to [place]. He wants to know when he needs to come back to see Dr. Mariah Milling and Dr. [name]?

## 2014-11-29 NOTE — Telephone Encounter (Signed)
Pt is prn, he only comes in if he needs to.

## 2014-11-30 ENCOUNTER — Ambulatory Visit (INDEPENDENT_AMBULATORY_CARE_PROVIDER_SITE_OTHER): Payer: Commercial Managed Care - HMO

## 2014-11-30 DIAGNOSIS — Z23 Encounter for immunization: Secondary | ICD-10-CM | POA: Diagnosis not present

## 2014-12-06 ENCOUNTER — Telehealth: Payer: Self-pay | Admitting: *Deleted

## 2014-12-06 NOTE — Telephone Encounter (Signed)
Pt calling stating he is not moving to [facility] and would like to get an apt so we can change his battery.  He has a device Please advise.

## 2014-12-12 ENCOUNTER — Encounter: Payer: Self-pay | Admitting: Internal Medicine

## 2014-12-12 ENCOUNTER — Encounter: Payer: Self-pay | Admitting: *Deleted

## 2014-12-12 ENCOUNTER — Ambulatory Visit (INDEPENDENT_AMBULATORY_CARE_PROVIDER_SITE_OTHER): Payer: Commercial Managed Care - HMO | Admitting: Internal Medicine

## 2014-12-12 VITALS — BP 118/64 | HR 65 | Ht 67.0 in | Wt 175.0 lb

## 2014-12-12 DIAGNOSIS — Z01812 Encounter for preprocedural laboratory examination: Secondary | ICD-10-CM

## 2014-12-12 DIAGNOSIS — I255 Ischemic cardiomyopathy: Secondary | ICD-10-CM | POA: Diagnosis not present

## 2014-12-12 LAB — CUP PACEART INCLINIC DEVICE CHECK
Brady Statistic RA Percent Paced: 60.55 %
Date Time Interrogation Session: 20161025113704
HIGH POWER IMPEDANCE MEASURED VALUE: 41 Ohm
HighPow Impedance: 54 Ohm
Implantable Lead Location: 753859
Implantable Lead Location: 753860
Implantable Lead Model: 185
Lead Channel Sensing Intrinsic Amplitude: 12.527 mV
Lead Channel Setting Pacing Pulse Width: 0.4 ms
Lead Channel Setting Sensing Sensitivity: 0.6 mV
MDC IDC LEAD IMPLANT DT: 20090326
MDC IDC LEAD IMPLANT DT: 20090326
MDC IDC LEAD SERIAL: 225056
MDC IDC MSMT BATTERY VOLTAGE: 2.62 V
MDC IDC MSMT LEADCHNL RA IMPEDANCE VALUE: 584 Ohm
MDC IDC MSMT LEADCHNL RA SENSING INTR AMPL: 3.018 mV
MDC IDC MSMT LEADCHNL RV IMPEDANCE VALUE: 496 Ohm
MDC IDC SET LEADCHNL RA PACING AMPLITUDE: 2 V
MDC IDC SET LEADCHNL RV PACING AMPLITUDE: 2 V
MDC IDC STAT BRADY AP VP PERCENT: 0.31 %
MDC IDC STAT BRADY AP VS PERCENT: 60.24 %
MDC IDC STAT BRADY AS VP PERCENT: 0.15 %
MDC IDC STAT BRADY AS VS PERCENT: 39.3 %
MDC IDC STAT BRADY RV PERCENT PACED: 0.45 %

## 2014-12-12 LAB — FECAL OCCULT BLOOD, GUAIAC: FECAL OCCULT BLD: NEGATIVE

## 2014-12-12 NOTE — Patient Instructions (Addendum)
Medication Instructions: - no changes  Labwork: - Your physician recommends that you return for lab work: 12/22/14 (BMP/ CBC/ INR).  Procedures/Testing: - Your physician has recommended that you have a defibrillator generator (battery) change. Please see the instruction sheet given to you today for more information.  Follow-Up: - Your physician recommends that you schedule a follow-up appointment in: 10 days for a wound check with the device clinic in [place].  - Your physician wants you to follow-up in: 6 months with Dr. Mariah Milling. You will receive a reminder letter in the mail two months in advance. If you don't receive a letter, please call our office to schedule the follow-up appointment.  Any Additional Special Instructions Will Be Listed Below (If Applicable). - none

## 2014-12-12 NOTE — Progress Notes (Signed)
Patient Care Team: Eustaquio Boyden, MD as PCP - General (Family Medicine)   HPI  Paul Moore is a 64 y.o. male Seen in followup for ICD implanted for primary prevention in the setting of coronary artery disease, bypass x5 in July 2009;   The patient denies chest pain, shortness of breath, nocturnal dyspnea, orthopnea or peripheral edema. There have been no palpitations, lightheadedness or syncope.   He was to move to [place]. His device has reached ERI. He has elected to stay in the area and would like [place] to change out his defibrillator.   Echocardiogram in 2011 demonstrated LV dysfunction EF 30-35% with inferolateral septal and apical wall motion abnormalities.  Last stress test in November 2008, treadmill where he exercised only for 3 minutes, 5 minutes. The large region of decreased perfusion in the inferior and lateral walls no ischemia, ejection fraction 22%.  Last catheterization December 2008 with a PTCA and stenting of the RCA, Promus stent 3.0 x 28 mm. occluded vein graft to the RCA at the ostium, severe diffuse native RCA disease with 90% proximal, 90% mid, high-grade PDA narrowing in a small vessel, moderate stenosis at the ostium of the vein graft to the diagonal, patent LIMA and patent vein graft to the circumflex   His wife is incapacitated by Mnire's disease   Past Medical History  Diagnosis Date  . Hypertension   . PVD (peripheral vascular disease) (HCC)     iliac stenosis  . Ischemic cardiomyopathy   . Dyslipidemia   . Coronary artery disease 12    MI age 64 yo s/p PCI  . Beta-blocker intolerance        . 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 Ex-smoker   . Diabetes mellitus type 2, controlled, with complications (HCC)     CAD  . Erectile dysfunction     failed PDE5 inhibitors, good results with pump    Past Surgical History  Procedure Laterality Date  . Coronary artery bypass graft  2008    5v ([facility])  . Iliac artery stent Right     [facility]  .  Cardiac defibrillator placement  2009    ICD in place [name])  . Percutaneous coronary stent intervention (pci-s)  2000  . Incision and drainage perirectal abscess      Current Outpatient Prescriptions  Medication Sig Dispense Refill  . aspirin 81 MG tablet Take 1 tablet (81 mg total) by mouth daily. 30 tablet 0  . clopidogrel (PLAVIX) 75 MG tablet TAKE ONE TABLET BY MOUTH EVERY DAY 90 tablet 3  . furosemide (LASIX) 40 MG tablet Take 1 tablet (40 mg total) by mouth daily. 90 tablet 3  . isosorbide mononitrate (IMDUR) 30 MG 24 hr tablet TAKE TWO TABLETS (60 MG) BY MOUTH EVERY DAY AT BEDTIME 180 tablet 3  . losartan (COZAAR) 50 MG tablet Take 1 tablet (50 mg total) by mouth daily. 90 tablet 3  . potassium chloride SA (K-DUR,KLOR-CON) 20 MEQ tablet Take 1 tablet (20 mEq total) by mouth daily. 90 tablet 3  . rosuvastatin (CRESTOR) 40 MG tablet Take 1 tablet (40 mg total) by mouth daily. 90 tablet 3   No current facility-administered medications for this visit.    Allergies  Allergen Reactions  . Amiodarone     REACTION: Hives  . Beta Adrenergic Blockers Other (See Comments)    Fatigue, weakness, diarrhea    Review of Systems negative except from HPI and PMH  Physical Exam BP 118/64 mmHg  Pulse 65  Ht  (1.702 m)  Wt 175 lb (79.379 kg)  BMI 27.40 kg/m2 Well developed and well nourished in no acute distress HENT normal E scleral and icterus clear Neck Supple JVP flat; carotids brisk and full Clear to ausculation Device pocket well healed; without hematoma or erythema.  There is no tethering Regular rate and rhythm, no murmurs gallops or rub Soft with active bowel sounds No clubbing cyanosis none Edema Alert and oriented, grossly normal motor and sensory function Skin Warm and Dry  ECG demonstrates atrial pacing Intervals 22/09/39     Assessment and  Plan  Sinus node dysfunction  Ischemic cardiomyopathy  Congestive heart failure-chronic-systolic-class  II  Implantable defibrillator-Medtronic dual chamber  Device as reached ERI  He is euvolemic; continue current medications  Without symptoms of ischemia  We have reviewed the benefits and risks of generator replacement.  These include but are not limited to lead fracture and infection.  The patient understands, agrees and is willing to proceed.

## 2014-12-13 ENCOUNTER — Ambulatory Visit: Payer: Commercial Managed Care - HMO | Admitting: Cardiovascular Disease

## 2014-12-22 ENCOUNTER — Other Ambulatory Visit (INDEPENDENT_AMBULATORY_CARE_PROVIDER_SITE_OTHER): Payer: Commercial Managed Care - HMO | Admitting: *Deleted

## 2014-12-22 DIAGNOSIS — Z01812 Encounter for preprocedural laboratory examination: Secondary | ICD-10-CM

## 2014-12-22 DIAGNOSIS — I255 Ischemic cardiomyopathy: Secondary | ICD-10-CM | POA: Diagnosis not present

## 2014-12-23 LAB — BASIC METABOLIC PANEL
BUN/Creatinine Ratio: 11 (ref 10–22)
BUN: 10 mg/dL (ref 8–27)
CALCIUM: 9.2 mg/dL (ref 8.6–10.2)
CO2: 20 mmol/L (ref 18–29)
CREATININE: 0.94 mg/dL (ref 0.76–1.27)
Chloride: 104 mmol/L (ref 97–106)
GFR calc Af Amer: 101 mL/min/{1.73_m2} (ref >59)
GFR, EST NON AFRICAN AMERICAN: 87 mL/min/{1.73_m2} (ref >59)
Glucose: 93 mg/dL (ref 65–99)
POTASSIUM: 4.1 mmol/L (ref 3.5–5.2)
Sodium: 140 mmol/L (ref 136–144)

## 2014-12-23 LAB — PROTIME-INR
INR: 1 (ref 0.8–1.2)
Prothrombin Time: 10.8 s (ref 9.1–12.0)

## 2014-12-23 LAB — CBC WITH DIFFERENTIAL/PLATELET
BASOS ABS: 0 10*3/uL (ref 0.0–0.2)
BASOS: 1 %
EOS (ABSOLUTE): 0.1 10*3/uL (ref 0.0–0.4)
Eos: 1 %
Hematocrit: 42.1 % (ref 37.5–51.0)
Hemoglobin: 14.5 g/dL (ref 12.6–17.7)
IMMATURE GRANS (ABS): 0 10*3/uL (ref 0.0–0.1)
Immature Granulocytes: 0 %
LYMPHS: 21 %
Lymphocytes Absolute: 1.4 10*3/uL (ref 0.7–3.1)
MCH: 31.9 pg (ref 26.6–33.0)
MCHC: 34.4 g/dL (ref 31.5–35.7)
MCV: 93 fL (ref 79–97)
MONOCYTES: 8 %
Monocytes Absolute: 0.5 10*3/uL (ref 0.1–0.9)
NEUTROS PCT: 69 %
Neutrophils Absolute: 4.4 10*3/uL (ref 1.4–7.0)
PLATELETS: 210 10*3/uL (ref 150–379)
RBC: 4.55 x10E6/uL (ref 4.14–5.80)
RDW: 13.4 % (ref 12.3–15.4)
WBC: 6.3 10*3/uL (ref 3.4–10.8)

## 2014-12-29 ENCOUNTER — Encounter (HOSPITAL_COMMUNITY)
Admission: RE | Disposition: A | Discharge status: Home / Self Care | Payer: Commercial Managed Care - HMO | Source: Ambulatory Visit | Attending: Internal Medicine

## 2014-12-29 ENCOUNTER — Encounter (HOSPITAL_COMMUNITY): Payer: Self-pay | Admitting: Internal Medicine

## 2014-12-29 ENCOUNTER — Ambulatory Visit (HOSPITAL_COMMUNITY)
Admission: RE | Admit: 2014-12-29 | Discharge: 2014-12-29 | Disposition: A | Discharge status: Home / Self Care | Payer: Commercial Managed Care - HMO | Source: Ambulatory Visit | Attending: Internal Medicine | Admitting: Internal Medicine

## 2014-12-29 DIAGNOSIS — Z7902 Long term (current) use of antithrombotics/antiplatelets: Secondary | ICD-10-CM | POA: Insufficient documentation

## 2014-12-29 DIAGNOSIS — Z7982 Long term (current) use of aspirin: Secondary | ICD-10-CM | POA: Diagnosis not present

## 2014-12-29 DIAGNOSIS — I252 Old myocardial infarction: Secondary | ICD-10-CM | POA: Insufficient documentation

## 2014-12-29 DIAGNOSIS — Z01812 Encounter for preprocedural laboratory examination: Secondary | ICD-10-CM

## 2014-12-29 DIAGNOSIS — I739 Peripheral vascular disease, unspecified: Secondary | ICD-10-CM | POA: Insufficient documentation

## 2014-12-29 DIAGNOSIS — I5022 Chronic systolic (congestive) heart failure: Secondary | ICD-10-CM | POA: Diagnosis not present

## 2014-12-29 DIAGNOSIS — I255 Ischemic cardiomyopathy: Secondary | ICD-10-CM | POA: Insufficient documentation

## 2014-12-29 DIAGNOSIS — I2510 Atherosclerotic heart disease of native coronary artery without angina pectoris: Secondary | ICD-10-CM | POA: Insufficient documentation

## 2014-12-29 DIAGNOSIS — I495 Sick sinus syndrome: Secondary | ICD-10-CM | POA: Insufficient documentation

## 2014-12-29 DIAGNOSIS — Z87891 Personal history of nicotine dependence: Secondary | ICD-10-CM | POA: Insufficient documentation

## 2014-12-29 DIAGNOSIS — Z951 Presence of aortocoronary bypass graft: Secondary | ICD-10-CM | POA: Diagnosis not present

## 2014-12-29 DIAGNOSIS — Z4502 Encounter for adjustment and management of automatic implantable cardiac defibrillator: Secondary | ICD-10-CM | POA: Insufficient documentation

## 2014-12-29 DIAGNOSIS — T82110A Breakdown (mechanical) of cardiac electrode, initial encounter: Secondary | ICD-10-CM | POA: Diagnosis not present

## 2014-12-29 DIAGNOSIS — I110 Hypertensive heart disease with heart failure: Secondary | ICD-10-CM | POA: Diagnosis not present

## 2014-12-29 DIAGNOSIS — E785 Hyperlipidemia, unspecified: Secondary | ICD-10-CM | POA: Insufficient documentation

## 2014-12-29 DIAGNOSIS — Z955 Presence of coronary angioplasty implant and graft: Secondary | ICD-10-CM | POA: Insufficient documentation

## 2014-12-29 DIAGNOSIS — E1169 Type 2 diabetes mellitus with other specified complication: Secondary | ICD-10-CM | POA: Insufficient documentation

## 2014-12-29 HISTORY — PX: EP IMPLANTABLE DEVICE: SHX172B

## 2014-12-29 LAB — SURGICAL PCR SCREEN
MRSA, PCR: NEGATIVE
Staphylococcus aureus: POSITIVE — AB

## 2014-12-29 SURGERY — ICD/BIV ICD GENERATOR CHANGEOUT

## 2014-12-29 MED ORDER — HEPARIN (PORCINE) IN NACL 2-0.9 UNIT/ML-% IJ SOLN
INTRAMUSCULAR | Status: AC
  Filled 2014-12-29: qty 1000

## 2014-12-29 MED ORDER — CHLORHEXIDINE GLUCONATE 4 % EX LIQD
60.0000 mL | Freq: Once | CUTANEOUS | Status: DC
  Filled 2014-12-29: qty 60

## 2014-12-29 MED ORDER — CEFAZOLIN SODIUM-DEXTROSE 2-3 GM-% IV SOLR: 2.0000 g | INTRAVENOUS | Status: DC

## 2014-12-29 MED ORDER — FENTANYL CITRATE (PF) 100 MCG/2ML IJ SOLN: INTRAMUSCULAR | Status: DC | PRN

## 2014-12-29 MED ORDER — ACETAMINOPHEN 325 MG PO TABS: 325.0000 mg | ORAL_TABLET | ORAL | Status: DC | PRN

## 2014-12-29 MED ORDER — CEFAZOLIN SODIUM-DEXTROSE 2-3 GM-% IV SOLR
INTRAVENOUS | Status: AC
  Filled 2014-12-29: qty 50

## 2014-12-29 MED ORDER — CEFAZOLIN SODIUM-DEXTROSE 2-3 GM-% IV SOLR
INTRAVENOUS | Status: DC | PRN
  Administered 2014-12-29: 2 g via INTRAVENOUS

## 2014-12-29 MED ORDER — MIDAZOLAM HCL 5 MG/5ML IJ SOLN
INTRAMUSCULAR | Status: DC | PRN
  Administered 2014-12-29: 2 mg via INTRAVENOUS

## 2014-12-29 MED ORDER — MUPIROCIN 2 % EX OINT
1.0000 "application " | TOPICAL_OINTMENT | Freq: Once | CUTANEOUS | Status: AC
  Administered 2014-12-29: 1 via TOPICAL
  Filled 2014-12-29: qty 22

## 2014-12-29 MED ORDER — SODIUM CHLORIDE 0.9 % IV SOLN: INTRAVENOUS | Status: DC

## 2014-12-29 MED ORDER — SODIUM CHLORIDE 0.9 % IV SOLN
INTRAVENOUS | Status: DC
  Administered 2014-12-29: 06:00:00 via INTRAVENOUS

## 2014-12-29 MED ORDER — MUPIROCIN 2 % EX OINT
TOPICAL_OINTMENT | CUTANEOUS | Status: AC
  Filled 2014-12-29: qty 22

## 2014-12-29 MED ORDER — SODIUM CHLORIDE 0.9 % IR SOLN
Status: AC
  Filled 2014-12-29: qty 2

## 2014-12-29 MED ORDER — LIDOCAINE HCL (PF) 1 % IJ SOLN
INTRAMUSCULAR | Status: AC
  Filled 2014-12-29: qty 30

## 2014-12-29 MED ORDER — SODIUM CHLORIDE 0.9 % IR SOLN: 80.0000 mg | Status: DC

## 2014-12-29 MED ORDER — ONDANSETRON HCL 4 MG/2ML IJ SOLN: 4.0000 mg | Freq: Four times a day (QID) | INTRAMUSCULAR | Status: DC | PRN

## 2014-12-29 MED ORDER — MIDAZOLAM HCL 5 MG/5ML IJ SOLN
INTRAMUSCULAR | Status: AC
  Filled 2014-12-29: qty 25

## 2014-12-29 MED ORDER — FENTANYL CITRATE (PF) 100 MCG/2ML IJ SOLN
INTRAMUSCULAR | Status: AC
  Filled 2014-12-29: qty 4

## 2014-12-29 SURGICAL SUPPLY — 6 items
CABLE SURGICAL S-101-97-12 (CABLE) ×3 IMPLANT
HEMOSTAT SURGICEL 2X4 FIBR (HEMOSTASIS) ×3 IMPLANT
ICD EVERA XT DR DDBB1D1 (ICD Generator) ×3 IMPLANT
MERM D154AWG VIRTUOSO DR PUL469214H ×3 IMPLANT
PAD DEFIB LIFELINK (PAD) ×3 IMPLANT
TRAY PACEMAKER INSERTION (PACKS) ×3 IMPLANT

## 2014-12-29 NOTE — Interval H&P Note (Signed)
ICD Criteria  Current LVEF:30%. Within 12 months prior to implant: No   Heart failure history: Yes, Class II  Cardiomyopathy history: Yes, Ischemic Cardiomyopathy.  Atrial Fibrillation/Atrial Flutter: No.  Ventricular tachycardia history: No.  Cardiac arrest history: No.  History of syndromes with risk of sudden death: No.  Previous ICD: Yes, Reason for ICD:  Primary prevention.  Current ICD indication: Primary  PPM indication: No.   Class I or II Bradycardia indication present: No  Beta Blocker therapy for 3 or more months: No, medical reason.  Ace Inhibitor/ARB therapy for 3 or more months: Yes, prescribed.   History and Physical Interval Note:  12/29/2014 6:34 AM  Paul Moore  has presented today for surgery, with the diagnosis of cm/eri  The various methods of treatment have been discussed with the patient and family. After consideration of risks, benefits and other options for treatment, the patient has consented to  Procedure(s):  ICD [employer] (N/A) as a surgical intervention .  The patient's history has been reviewed, patient examined, no change in status, stable for surgery.  I have reviewed the patient's chart and labs.  Questions were answered to the patient's satisfaction.     Sherryl Manges

## 2014-12-29 NOTE — Discharge Instructions (Signed)

## 2014-12-29 NOTE — H&P (View-Only) (Signed)
Patient Care Team: Paul Boyden, MD as PCP - General (Family Medicine)   HPI  Paul Moore is a 63 y.o. male Seen in followup for ICD implanted for primary prevention in the setting of coronary artery disease, bypass x5 in July 2009;   The patient denies chest pain, shortness of breath, nocturnal dyspnea, orthopnea or peripheral edema. There have been no palpitations, lightheadedness or syncope.   He was to move to [place]. His device has reached ERI. He has elected to stay in the area and would like [place] to change out his defibrillator.   Echocardiogram in 2011 demonstrated LV dysfunction EF 30-35% with inferolateral septal and apical wall motion abnormalities.  Last stress test in November 2008, treadmill where he exercised only for 3 minutes, 5 minutes. The large region of decreased perfusion in the inferior and lateral walls no ischemia, ejection fraction 22%.  Last catheterization December 2008 with a PTCA and stenting of the RCA, Promus stent 3.0 x 28 mm. occluded vein graft to the RCA at the ostium, severe diffuse native RCA disease with 90% proximal, 90% mid, high-grade PDA narrowing in a small vessel, moderate stenosis at the ostium of the vein graft to the diagonal, patent LIMA and patent vein graft to the circumflex   His wife is incapacitated by Mnire's disease   Past Medical History  Diagnosis Date  . Hypertension   . PVD (peripheral vascular disease) (HCC)     iliac stenosis  . Ischemic cardiomyopathy   . Dyslipidemia   . Coronary artery disease 12    MI age 34 yo s/p PCI  . Beta-blocker intolerance        . 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 Ex-smoker   . Diabetes mellitus type 2, controlled, with complications (HCC)     CAD  . Erectile dysfunction     failed PDE5 inhibitors, good results with pump    Past Surgical History  Procedure Laterality Date  . Coronary artery bypass graft  2008    5v (Paul Moore)  . Iliac artery stent Right     Paul Moore  .  Cardiac defibrillator placement  2009    ICD in place Paul Moore)  . Percutaneous coronary stent intervention (pci-s)  2000  . Incision and drainage perirectal abscess      Current Outpatient Prescriptions  Medication Sig Dispense Refill  . aspirin 81 MG tablet Take 1 tablet (81 mg total) by mouth daily. 30 tablet 0  . clopidogrel (PLAVIX) 75 MG tablet TAKE ONE TABLET BY MOUTH EVERY DAY 90 tablet 3  . furosemide (LASIX) 40 MG tablet Take 1 tablet (40 mg total) by mouth daily. 90 tablet 3  . isosorbide mononitrate (IMDUR) 30 MG 24 hr tablet TAKE TWO TABLETS (60 MG) BY MOUTH EVERY DAY AT BEDTIME 180 tablet 3  . losartan (COZAAR) 50 MG tablet Take 1 tablet (50 mg total) by mouth daily. 90 tablet 3  . potassium chloride SA (K-DUR,KLOR-CON) 20 MEQ tablet Take 1 tablet (20 mEq total) by mouth daily. 90 tablet 3  . rosuvastatin (CRESTOR) 40 MG tablet Take 1 tablet (40 mg total) by mouth daily. 90 tablet 3   No current facility-administered medications for this visit.    Allergies  Allergen Reactions  . Amiodarone     REACTION: Hives  . Beta Adrenergic Blockers Other (See Comments)    Fatigue, weakness, diarrhea    Review of Systems negative except from HPI and PMH  Physical Exam BP 118/64 mmHg  Pulse 65  Ht 5' 7" (1.702 m)  Wt 175 lb (79.379 kg)  BMI 27.40 kg/m2 Well developed and well nourished in no acute distress HENT normal E scleral and icterus clear Neck Supple JVP flat; carotids brisk and full Clear to ausculation Device pocket well healed; without hematoma or erythema.  There is no tethering Regular rate and rhythm, no murmurs gallops or rub Soft with active bowel sounds No clubbing cyanosis none Edema Alert and oriented, grossly normal motor and sensory function Skin Warm and Dry  ECG demonstrates atrial pacing Intervals 22/09/39     Assessment and  Plan  Sinus node dysfunction  Ischemic cardiomyopathy  Congestive heart failure-chronic-systolic-class  II  Implantable defibrillator-Medtronic dual chamber  Device as reached ERI  He is euvolemic; continue current medications  Without symptoms of ischemia  We have reviewed the benefits and risks of generator replacement.  These include but are not limited to lead fracture and infection.  The patient understands, agrees and is willing to proceed.          

## 2015-01-01 MED FILL — Gentamicin Sulfate Inj 40 MG/ML: INTRAMUSCULAR | Qty: 2 | Status: AC

## 2015-01-01 MED FILL — Fentanyl Citrate Preservative Free (PF) Inj 100 MCG/2ML: INTRAMUSCULAR | Qty: 2 | Status: AC

## 2015-01-01 MED FILL — Heparin Sodium (Porcine) 2 Unit/ML in Sodium Chloride 0.9%: INTRAMUSCULAR | Qty: 1000 | Status: AC

## 2015-01-01 MED FILL — Sodium Chloride Irrigation Soln 0.9%: Qty: 500 | Status: AC

## 2015-01-02 ENCOUNTER — Encounter: Payer: Self-pay | Admitting: Internal Medicine

## 2015-01-02 ENCOUNTER — Other Ambulatory Visit: Payer: Self-pay | Admitting: Family Medicine

## 2015-01-02 ENCOUNTER — Other Ambulatory Visit (INDEPENDENT_AMBULATORY_CARE_PROVIDER_SITE_OTHER): Payer: Commercial Managed Care - HMO

## 2015-01-02 DIAGNOSIS — E118 Type 2 diabetes mellitus with unspecified complications: Secondary | ICD-10-CM

## 2015-01-02 DIAGNOSIS — E785 Hyperlipidemia, unspecified: Secondary | ICD-10-CM

## 2015-01-02 LAB — LIPID PANEL
CHOL/HDL RATIO: 3
Cholesterol: 126 mg/dL (ref 0–200)
HDL: 37.5 mg/dL — ABNORMAL LOW (ref >39.00)
LDL Cholesterol: 72 mg/dL (ref 0–99)
NONHDL: 88.74
Triglycerides: 86 mg/dL (ref 0.0–149.0)
VLDL: 17.2 mg/dL (ref 0.0–40.0)

## 2015-01-02 LAB — HEMOGLOBIN A1C: HEMOGLOBIN A1C: 6.4 % (ref 4.6–6.5)

## 2015-01-08 ENCOUNTER — Ambulatory Visit (INDEPENDENT_AMBULATORY_CARE_PROVIDER_SITE_OTHER): Payer: Commercial Managed Care - HMO | Admitting: *Deleted

## 2015-01-08 ENCOUNTER — Encounter: Payer: Self-pay | Admitting: Internal Medicine

## 2015-01-08 DIAGNOSIS — I255 Ischemic cardiomyopathy: Secondary | ICD-10-CM

## 2015-01-08 DIAGNOSIS — I5022 Chronic systolic (congestive) heart failure: Secondary | ICD-10-CM

## 2015-01-08 LAB — CUP PACEART INCLINIC DEVICE CHECK
Battery Voltage: 3.16 V
Brady Statistic AP VP Percent: 0.15 %
Brady Statistic AP VS Percent: 47.38 %
Brady Statistic AS VP Percent: 0.03 %
Brady Statistic RA Percent Paced: 47.53 %
Brady Statistic RV Percent Paced: 0.18 %
HIGH POWER IMPEDANCE MEASURED VALUE: 53 Ohm
HighPow Impedance: 60 Ohm
Implantable Lead Implant Date: 20090326
Implantable Lead Implant Date: 20090326
Implantable Lead Location: 753860
Implantable Lead Model: 185
Implantable Lead Serial Number: 225056
Lead Channel Impedance Value: 456 Ohm
Lead Channel Impedance Value: 513 Ohm
Lead Channel Pacing Threshold Amplitude: 0.5 V
Lead Channel Pacing Threshold Pulse Width: 0.4 ms
Lead Channel Pacing Threshold Pulse Width: 0.4 ms
Lead Channel Sensing Intrinsic Amplitude: 18.875 mV
Lead Channel Setting Pacing Amplitude: 2 V
Lead Channel Setting Pacing Pulse Width: 0.4 ms
Lead Channel Setting Sensing Sensitivity: 0.6 mV
MDC IDC LEAD LOCATION: 753859
MDC IDC MSMT BATTERY REMAINING LONGEVITY: 127 mo
MDC IDC MSMT LEADCHNL RA PACING THRESHOLD AMPLITUDE: 0.5 V
MDC IDC MSMT LEADCHNL RA SENSING INTR AMPL: 5.25 mV
MDC IDC MSMT LEADCHNL RV IMPEDANCE VALUE: 418 Ohm
MDC IDC SESS DTM: 20161121131736
MDC IDC SET LEADCHNL RV PACING AMPLITUDE: 2.5 V
MDC IDC STAT BRADY AS VS PERCENT: 52.44 %

## 2015-01-08 NOTE — Progress Notes (Signed)
Wound check appointment s/p generator replacement 12/29/14. Steri-strips removed. Wound without redness or edema. Incision edges approximated, wound well healed. Normal device function. Thresholds, sensing, and impedances consistent with implant measurements. Histogram distribution appropriate for patient and level of activity. No mode switches or ventricular arrhythmias noted. Patient educated about wound care, arm mobility, lifting restrictions, shock plan. ROV with SK 03/29/15 in [place].

## 2015-01-10 ENCOUNTER — Ambulatory Visit (INDEPENDENT_AMBULATORY_CARE_PROVIDER_SITE_OTHER): Payer: Commercial Managed Care - HMO | Admitting: Family Medicine

## 2015-01-10 ENCOUNTER — Encounter: Payer: Self-pay | Admitting: Family Medicine

## 2015-01-10 VITALS — BP 104/50 | HR 78 | Temp 97.8°F | Ht 68.0 in | Wt 169.8 lb

## 2015-01-10 DIAGNOSIS — I25719 Atherosclerosis of autologous vein coronary artery bypass graft(s) with unspecified angina pectoris: Secondary | ICD-10-CM

## 2015-01-10 DIAGNOSIS — I255 Ischemic cardiomyopathy: Secondary | ICD-10-CM

## 2015-01-10 DIAGNOSIS — Z0000 Encounter for general adult medical examination without abnormal findings: Secondary | ICD-10-CM

## 2015-01-10 DIAGNOSIS — I10 Essential (primary) hypertension: Secondary | ICD-10-CM

## 2015-01-10 DIAGNOSIS — E118 Type 2 diabetes mellitus with unspecified complications: Secondary | ICD-10-CM

## 2015-01-10 DIAGNOSIS — E785 Hyperlipidemia, unspecified: Secondary | ICD-10-CM

## 2015-01-10 DIAGNOSIS — Z95810 Presence of automatic (implantable) cardiac defibrillator: Secondary | ICD-10-CM

## 2015-01-10 DIAGNOSIS — Z7189 Other specified counseling: Secondary | ICD-10-CM | POA: Insufficient documentation

## 2015-01-10 NOTE — Patient Instructions (Addendum)
Advanced directive packet today Pass by lab for urinalysis. Return as needed or in 1 year for next medicare wellness visit Work on decreasing simple carbs. Look into mediterranean diet.   Health Maintenance, Male A healthy lifestyle and preventative care can promote health and wellness.  Maintain regular health, dental, and eye exams.  Eat a healthy diet. Foods like vegetables, fruits, whole grains, low-fat dairy products, and lean protein foods contain the nutrients you need and are low in calories. Decrease your intake of foods high in solid fats, added sugars, and salt. Get information about a proper diet from your health care provider, if necessary.  Regular physical exercise is one of the most important things you can do for your health. Most adults should get at least 150 minutes of moderate-intensity exercise (any activity that increases your heart rate and causes you to sweat) each week. In addition, most adults need muscle-strengthening exercises on 2 or more days a week.   Maintain a healthy weight. The body mass index (BMI) is a screening tool to identify possible weight problems. It provides an estimate of body fat based on height and weight. Your health care provider can find your BMI and can help you achieve or maintain a healthy weight. For males 20 years and older:  A BMI below 18.5 is considered underweight.  A BMI of 18.5 to 24.9 is normal.  A BMI of 25 to 29.9 is considered overweight.  A BMI of 30 and above is considered obese.  Maintain normal blood lipids and cholesterol by exercising and minimizing your intake of saturated fat. Eat a balanced diet with plenty of fruits and vegetables. Blood tests for lipids and cholesterol should begin at age 68 and be repeated every 5 years. If your lipid or cholesterol levels are high, you are over age 26, or you are at high risk for heart disease, you may need your cholesterol levels checked more frequently.Ongoing high lipid and  cholesterol levels should be treated with medicines if diet and exercise are not working.  If you smoke, find out from your health care provider how to quit. If you do not use tobacco, do not start.  Lung cancer screening is recommended for adults aged 55-80 years who are at high risk for developing lung cancer because of a history of smoking. A yearly low-dose CT scan of the lungs is recommended for people who have at least a 30-pack-year history of smoking and are current smokers or have quit within the past 15 years. A pack year of smoking is smoking an average of 1 pack of cigarettes a day for 1 year (for example, a 30-pack-year history of smoking could mean smoking 1 pack a day for 30 years or 2 packs a day for 15 years). Yearly screening should continue until the smoker has stopped smoking for at least 15 years. Yearly screening should be stopped for people who develop a health problem that would prevent them from having lung cancer treatment.  If you choose to drink alcohol, do not have more than 2 drinks per day. One drink is considered to be 12 oz (360 mL) of beer, 5 oz (150 mL) of wine, or 1.5 oz (45 mL) of liquor.  Avoid the use of street drugs. Do not share needles with anyone. Ask for help if you need support or instructions about stopping the use of drugs.  High blood pressure causes heart disease and increases the risk of stroke. High blood pressure is more likely to  develop in:  People who have blood pressure in the end of the normal range (100-139/85-89 mm Hg).  People who are overweight or obese.  People who are African American.  If you are 96-66 years of age, have your blood pressure checked every 3-5 years. If you are 29 years of age or older, have your blood pressure checked every year. You should have your blood pressure measured twice--once when you are at a hospital or clinic, and once when you are not at a hospital or clinic. Record the average of the two measurements. To  check your blood pressure when you are not at a hospital or clinic, you can use:  An automated blood pressure machine at a pharmacy.  A home blood pressure monitor.  If you are 18-75 years old, ask your health care provider if you should take aspirin to prevent heart disease.  Diabetes screening involves taking a blood sample to check your fasting blood sugar level. This should be done once every 3 years after age 55 if you are at a normal weight and without risk factors for diabetes. Testing should be considered at a younger age or be carried out more frequently if you are overweight and have at least 1 risk factor for diabetes.  Colorectal cancer can be detected and often prevented. Most routine colorectal cancer screening begins at the age of 107 and continues through age 73. However, your health care provider may recommend screening at an earlier age if you have risk factors for colon cancer. On a yearly basis, your health care provider may provide home test kits to check for hidden blood in the stool. A small camera at the end of a tube may be used to directly examine the colon (sigmoidoscopy or colonoscopy) to detect the earliest forms of colorectal cancer. Talk to your health care provider about this at age 92 when routine screening begins. A direct exam of the colon should be repeated every 5-10 years through age 80, unless early forms of precancerous polyps or small growths are found.  People who are at an increased risk for hepatitis B should be screened for this virus. You are considered at high risk for hepatitis B if:  You were born in a country where hepatitis B occurs often. Talk with your health care provider about which countries are considered high risk.  Your parents were born in a high-risk country and you have not received a shot to protect against hepatitis B (hepatitis B vaccine).  You have HIV or AIDS.  You use needles to inject street drugs.  You live with, or have sex  with, someone who has hepatitis B.  You are a man who has sex with other men (MSM).  You get hemodialysis treatment.  You take certain medicines for conditions like cancer, organ transplantation, and autoimmune conditions.  Hepatitis C blood testing is recommended for all people born from [birth year] through 1963 and any individual with known risk factors for hepatitis C.  Healthy men should no longer receive prostate-specific antigen (PSA) blood tests as part of routine cancer screening. Talk to your health care provider about prostate cancer screening.  Testicular cancer screening is not recommended for adolescents or adult males who have no symptoms. Screening includes self-exam, a health care provider exam, and other screening tests. Consult with your health care provider about any symptoms you have or any concerns you have about testicular cancer.  Practice safe sex. Use condoms and avoid high-risk sexual practices to reduce  the spread of sexually transmitted infections (STIs).  You should be screened for STIs, including gonorrhea and chlamydia if:  You are sexually active and are younger than 24 years.  You are older than 24 years, and your health care provider tells you that you are at risk for this type of infection.  Your sexual activity has changed since you were last screened, and you are at an increased risk for chlamydia or gonorrhea. Ask your health care provider if you are at risk.  If you are at risk of being infected with HIV, it is recommended that you take a prescription medicine daily to prevent HIV infection. This is called pre-exposure prophylaxis (PrEP). You are considered at risk if:  You are a man who has sex with other men (MSM).  You are a heterosexual man who is sexually active with multiple partners.  You take drugs by injection.  You are sexually active with a partner who has HIV.  Talk with your health care provider about whether you are at high risk of being  infected with HIV. If you choose to begin PrEP, you should first be tested for HIV. You should then be tested every 3 months for as long as you are taking PrEP.  Use sunscreen. Apply sunscreen liberally and repeatedly throughout the day. You should seek shade when your shadow is shorter than you. Protect yourself by wearing long sleeves, pants, a wide-brimmed hat, and sunglasses year round whenever you are outdoors.  Tell your health care provider of new moles or changes in moles, especially if there is a change in shape or color. Also, tell your health care provider if a mole is larger than the size of a pencil eraser.  A one-time screening for abdominal aortic aneurysm (AAA) and surgical repair of large AAAs by ultrasound is recommended for men aged 58-75 years who are current or former smokers.  Stay current with your vaccines (immunizations).   This information is not intended to replace advice given to you by your health care provider. Make sure you discuss any questions you have with your health care provider.   Document Released: 08/02/2007 Document Revised: 02/24/2014 Document Reviewed: 07/01/2010 Elsevier Interactive Patient Education [employer].

## 2015-01-10 NOTE — Assessment & Plan Note (Signed)
Appreciate cards care of patient. Discussing possible rpt stress test

## 2015-01-10 NOTE — Assessment & Plan Note (Signed)
Denies sxs. On imdur.

## 2015-01-10 NOTE — Assessment & Plan Note (Signed)
Chronic, good control continue crestor.

## 2015-01-10 NOTE — Assessment & Plan Note (Signed)
A1c great at 6.4% - diet controlled. ?now prediabetic with recent weight loss. Discussed eye exam, pt declines. Foot exam next visit.

## 2015-01-10 NOTE — Assessment & Plan Note (Signed)
Discussed. Advanced directives packet provided. Would like oldest daughter to be HCPOA.

## 2015-01-10 NOTE — Progress Notes (Signed)
Pre visit review using our clinic review tool, if applicable. No additional management support is needed unless otherwise documented below in the visit note. 

## 2015-01-10 NOTE — Assessment & Plan Note (Addendum)
S/p recent change by EP. Irregular heart rate noted today, has f/u with cards.

## 2015-01-10 NOTE — Progress Notes (Addendum)
BP 104/50 mmHg  Pulse 78  Temp(Src) 97.8 F (36.6 C) (Oral)  Ht 5\' 8"  (1.727 m)  Wt 169 lb 12.8 oz (77.021 kg)  BMI 25.82 kg/m2  SpO2 94%   CC: medicare wellness visit  Subjective:    Patient ID: Paul Moore, male    DOB: [birth year], 64 y.o.   MRN: [id number]  HPI: Paul Moore is a 64 y.o. male presenting on 01/10/2015 for Medicare Wellness   Established care 04/2014. CAD with ischemic cardiomyopathy on disability for cardiac conditions. Followed by Dr Mariah Milling and [facility]. Recent generator replacement for ICD  Hearing screen - passed (trouble at high frequencies). Denies trouble.  Vision screen - passed Fall risk screen - passed Depression screen - passed  Preventative: Colon cancer screening - does not want colonoscopy. recent FIT test by insurance normal (11/2014) Prostate cancer screening - PSA normal 04/2014. DRE today. Lung cancer screening - not candidate (quit >15 yrs ago)  Flu shot - 11/2014 Tdap 2012 Pneumonia shot - ~2006.  Shingles shot - 04/2013 Advanced directive discussion - Discussed. Advanced directives packet provided. Would like oldest daughter to be HCPOA. Seat belt use discussed Sunscreen use discussed. No changing moles on skin.  Lives with wife Occupation: disability from cardiac status Activity: walks 2-3 miles daily, 5 mi on stationary bike, wii workoup Diet: good water, fruits/vegetables daily  Relevant past medical, surgical, family and social history reviewed and updated as indicated. Interim medical history since our last visit reviewed. Allergies and medications reviewed and updated. Current Outpatient Prescriptions on File Prior to Visit  Medication Sig  . aspirin 81 MG tablet Take 1 tablet (81 mg total) by mouth daily.  . clopidogrel (PLAVIX) 75 MG tablet TAKE ONE TABLET BY MOUTH EVERY DAY (Patient taking differently: Take 75 mg by mouth daily. )  . furosemide (LASIX) 40 MG tablet Take 1 tablet (40 mg total) by mouth daily.  .  isosorbide mononitrate (IMDUR) 30 MG 24 hr tablet TAKE TWO TABLETS (60 MG) BY MOUTH EVERY DAY AT BEDTIME  . losartan (COZAAR) 50 MG tablet Take 1 tablet (50 mg total) by mouth daily.  . potassium chloride SA (K-DUR,KLOR-CON) 20 MEQ tablet Take 1 tablet (20 mEq total) by mouth daily.  . rosuvastatin (CRESTOR) 40 MG tablet Take 1 tablet (40 mg total) by mouth daily.   No current facility-administered medications on file prior to visit.    Review of Systems  Constitutional: Negative for fever, chills, activity change, appetite change, fatigue and unexpected weight change.  HENT: Negative for hearing loss.   Eyes: Negative for visual disturbance.  Respiratory: Negative for cough, chest tightness, shortness of breath and wheezing.   Cardiovascular: Negative for chest pain, palpitations and leg swelling.  Gastrointestinal: Negative for nausea, vomiting, abdominal pain, diarrhea, constipation, blood in stool and abdominal distention.  Genitourinary: Negative for hematuria and difficulty urinating.  Musculoskeletal: Negative for myalgias, arthralgias and neck pain.  Skin: Negative for rash.  Neurological: Negative for dizziness, seizures, syncope and headaches.  Hematological: Negative for adenopathy. Does not bruise/bleed easily.  Psychiatric/Behavioral: Negative for dysphoric mood. The patient is not nervous/anxious.    Per HPI unless specifically indicated in ROS section     Objective:    BP 104/50 mmHg  Pulse 78  Temp(Src) 97.8 F (36.6 C) (Oral)  Ht 5\' 8"  (1.727 m)  Wt 169 lb 12.8 oz (77.021 kg)  BMI 25.82 kg/m2  SpO2 94%  Wt Readings from Last 3 Encounters:  01/10/15 169 lb  12.8 oz (77.021 kg)  12/29/14 168 lb (76.204 kg)  12/12/14 175 lb (79.379 kg)    Physical Exam  Constitutional: He is oriented to person, place, and time. He appears well-developed and well-nourished. No distress.  HENT:  Head: Normocephalic and atraumatic.  Right Ear: Hearing, tympanic membrane, external  ear and ear canal normal.  Left Ear: Hearing, tympanic membrane, external ear and ear canal normal.  Nose: Nose normal.  Mouth/Throat: Uvula is midline, oropharynx is clear and moist and mucous membranes are normal. No oropharyngeal exudate, posterior oropharyngeal edema or posterior oropharyngeal erythema.  Eyes: Conjunctivae and EOM are normal. Pupils are equal, round, and reactive to light. No scleral icterus.  Neck: Normal range of motion. Neck supple. Carotid bruit is not present. No thyromegaly present.  Cardiovascular: Normal rate, normal heart sounds and intact distal pulses.  An irregular rhythm present.  No murmur heard. Pulses:      Radial pulses are 2+ on the right side, and 2+ on the left side.  Pulmonary/Chest: Effort normal and breath sounds normal. No respiratory distress. He has no wheezes. He has no rales.  Abdominal: Soft. Bowel sounds are normal. He exhibits no distension and no mass. There is no tenderness. There is no rebound and no guarding. A hernia is present. Hernia confirmed positive in the left inguinal area (large evident). Hernia confirmed negative in the right inguinal area.  Genitourinary: Rectum normal. Rectal exam shows no external hemorrhoid, no internal hemorrhoid, no fissure, no mass, no tenderness and anal tone normal. Prostate is enlarged (25gm). Prostate is not tender.  Musculoskeletal: Normal range of motion. He exhibits no edema.  Lymphadenopathy:    He has no cervical adenopathy.  Neurological: He is alert and oriented to person, place, and time.  CN grossly intact, station and gait intact Recall 2/3, 3/3 with cue Calculation 4/5 serial 7s  Skin: Skin is warm and dry. No rash noted.  Psychiatric: He has a normal mood and affect. His behavior is normal. Judgment and thought content normal.  Nursing note and vitals reviewed.     Assessment & Plan:  In h/o smoker, check UA today to eval for hematuria. Problem List Items Addressed This Visit     Medicare annual wellness visit, initial - Primary    I have personally reviewed the Medicare Annual Wellness questionnaire and have noted 1. The patient's medical and social history 2. Their use of alcohol, tobacco or illicit drugs 3. Their current medications and supplements 4. The patient's functional ability including ADL's, fall risks, home safety risks and hearing or visual impairment. Cognitive function has been assessed and addressed as indicated.  5. Diet and physical activity 6. Evidence for depression or mood disorders The patients weight, height, BMI have been recorded in the chart. I have made referrals, counseling and provided education to the patient based on review of the above and I have provided the pt with a written personalized care plan for preventive services. Provider list updated.. See scanned questionairre as needed for further documentation. Reviewed preventative protocols and updated unless pt declined.       ICD (implantable cardioverter-defibrillator), dual, in situ    S/p recent change by EP. Irregular heart rate noted today, has f/u with cards.      Hyperlipidemia    Chronic, good control continue crestor.       Health maintenance examination    Preventative protocols reviewed and updated unless pt declined. Discussed healthy diet and lifestyle.  Essential hypertension    Chronic, stable. Continue current regimen.      Diabetes mellitus type 2, controlled, with complications (HCC)    A1c great at 6.4% - diet controlled. ?now prediabetic with recent weight loss. Discussed eye exam, pt declines. Foot exam next visit.      CORONARY ATHEROSCLEROSIS, AUTOLOGOUS VEIN BYPASS GRAFT    Denies sxs. On imdur.       Cardiomyopathy, ischemic    Appreciate cards care of patient. Discussing possible rpt stress test      Advanced care planning/counseling discussion    Discussed. Advanced directives packet provided. Would like oldest daughter to be  HCPOA.          Follow up plan: Return in about 1 year (around 01/10/2016), or as needed, for medicare wellness visit.

## 2015-01-10 NOTE — Assessment & Plan Note (Signed)
Chronic, stable. Continue current regimen. 

## 2015-01-10 NOTE — Assessment & Plan Note (Signed)
Preventative protocols reviewed and updated unless pt declined. Discussed healthy diet and lifestyle.  

## 2015-01-10 NOTE — Assessment & Plan Note (Signed)

## 2015-02-12 ENCOUNTER — Other Ambulatory Visit: Payer: Self-pay | Admitting: Cardiovascular Disease

## 2015-03-06 ENCOUNTER — Other Ambulatory Visit: Payer: Self-pay | Admitting: Cardiovascular Disease

## 2015-03-21 ENCOUNTER — Telehealth: Payer: Self-pay

## 2015-03-21 NOTE — Telephone Encounter (Signed)
Paul Moore [phone]  [name] came by today and is needing a letter on letter head to be faxed to [facility] stating that he can live independently. Fax number is [phone]. Please let him know when this has been done.

## 2015-03-23 NOTE — Telephone Encounter (Signed)
Letter faxed and message left advising patient.

## 2015-03-23 NOTE — Telephone Encounter (Signed)
Letter written and in [employer] box. 

## 2015-03-23 NOTE — Telephone Encounter (Signed)
Patient called and asked to be called when letter is faxed.  Please call patient back at [phone].

## 2015-03-26 NOTE — Telephone Encounter (Signed)
Pt called, [employer] did not receive the letter.   Please confirm CORRECT fax number of [phone] and refax to them.  Best number to call pt when refaxed is [phone] / lt

## 2015-03-26 NOTE — Telephone Encounter (Signed)
Re-faxed letter and patient notified.

## 2015-03-29 ENCOUNTER — Encounter: Payer: Commercial Managed Care - HMO | Admitting: Internal Medicine

## 2015-04-02 NOTE — Progress Notes (Signed)
Patient Care Team: Eustaquio Boyden, MD as PCP - General (Family Medicine) Antonieta Iba, MD as Consulting Physician (Cardiology) Duke Salvia, MD as Consulting Physician (Cardiology)   HPI  Paul Moore is a 65 y.o. male Seen in followup for ICD implanted for primary prevention in the setting of coronary artery disease, bypass x5 in July 2009;   The patient denies chest pain, shortness of breath, nocturnal dyspnea, orthopnea or peripheral edema. There have been no palpitations, lightheadedness or syncope.   He was to move to [place]. His device reached ERI and he underwent generator replacement 11/16 He has done well      Echocardiogram in 2011 demonstrated LV dysfunction EF 30-35% with inferolateral septal and apical wall motion abnormalities.  Last stress test in November 2008, treadmill where he exercised only for 3 minutes, 5 minutes. The large region of decreased perfusion in the inferior and lateral walls no ischemia, ejection fraction 22%.  Last catheterization December 2008 with a PTCA and stenting of the RCA, Promus stent 3.0 x 28 mm. occluded vein graft to the RCA at the ostium, severe diffuse native RCA disease with 90% proximal, 90% mid, high-grade PDA narrowing in a small vessel, moderate stenosis at the ostium of the vein graft to the diagonal, patent LIMA and patent vein graft to the circumflex   His wife is incapacitated by Mnire's disease   Past Medical History  Diagnosis Date  . Hypertension   . PVD (peripheral vascular disease) (HCC)     iliac stenosis  . Ischemic cardiomyopathy   . Dyslipidemia   . Coronary artery disease 53    MI age 26 yo s/p PCI  . Beta-blocker intolerance        . 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 Ex-smoker   . Diabetes mellitus type 2, controlled, with complications (HCC)     CAD  . Erectile dysfunction     failed PDE5 inhibitors, good results with pump    Past Surgical History  Procedure Laterality Date  .  Coronary artery bypass graft  2008    5v ([facility])  . Iliac artery stent Right     [facility]  . Cardiac defibrillator placement  2009    ICD in place [name])  . Percutaneous coronary stent intervention (pci-s)  2000  . Incision and drainage perirectal abscess    . Ep implantable device N/A 12/29/2014    Procedure:  ICD Generator Changeout;  Surgeon: Duke Salvia, MD;  Location: [facility] INVASIVE CV LAB;  Service: Cardiovascular;  Laterality: N/A;    Current Outpatient Prescriptions  Medication Sig Dispense Refill  . aspirin 81 MG tablet Take 1 tablet (81 mg total) by mouth daily. 30 tablet 0  . clopidogrel (PLAVIX) 75 MG tablet TAKE ONE TABLET BY MOUTH EVERY DAY 90 tablet 3  . furosemide (LASIX) 40 MG tablet TAKE 1 TABLET EVERY DAY 90 tablet 3  . isosorbide mononitrate (IMDUR) 30 MG 24 hr tablet TAKE TWO TABLETS (60 MG) BY MOUTH EVERY DAY AT BEDTIME 180 tablet 3  . losartan (COZAAR) 50 MG tablet Take 1 tablet (50 mg total) by mouth daily. 90 tablet 3  . potassium chloride SA (K-DUR,KLOR-CON) 20 MEQ tablet Take 1 tablet (20 mEq total) by mouth daily. 90 tablet 3  . rosuvastatin (CRESTOR) 40 MG tablet Take 1 tablet (40 mg total) by mouth daily. 90 tablet 3   No current facility-administered medications for this visit.    Allergies  Allergen Reactions  . Beta Adrenergic Blockers  Other (See Comments)    Fatigue, weakness, diarrhea  . Amiodarone Other (See Comments)    REACTION: Hives    Review of Systems negative except from HPI and PMH  Physical Exam BP 120/78 mmHg  Pulse 67  Ht  (1.702 m)  Wt 162 lb 8 oz (73.71 kg)  BMI 25.45 kg/m2 Well developed and well nourished in no acute distress HENT normal E scleral and icterus clear Neck Supple JVP flat; carotids brisk and full Clear to ausculation Device pocket well healed; without hematoma or erythema.  There is no tethering Regular rate and rhythm, no murmurs gallops or rub Soft with active bowel sounds No clubbing cyanosis  none Edema Alert and oriented, grossly normal motor and sensory function Skin Warm and Dry  ECG demonstrates atrial pacing @ 61 Intervals 24/10/41 Axis left -36     Assessment and  Plan  Sinus node dysfunction  Ischemic cardiomyopathy  Congestive heart failure-chronic-systolic-class II  Implantable defibrillator-Medtronic dual chamber The patient's device was interrogated.  The information was reviewed. No changes were made in the programming.     He is euvolemic; continue current medications  Without symptoms of ischemia  Reasonable heart rate excursion

## 2015-04-03 ENCOUNTER — Encounter: Payer: Self-pay | Admitting: Internal Medicine

## 2015-04-03 ENCOUNTER — Ambulatory Visit (INDEPENDENT_AMBULATORY_CARE_PROVIDER_SITE_OTHER): Payer: Commercial Managed Care - HMO | Admitting: Internal Medicine

## 2015-04-03 VITALS — BP 120/78 | HR 67 | Ht 67.0 in | Wt 162.5 lb

## 2015-04-03 DIAGNOSIS — Z95810 Presence of automatic (implantable) cardiac defibrillator: Secondary | ICD-10-CM | POA: Diagnosis not present

## 2015-04-03 DIAGNOSIS — I255 Ischemic cardiomyopathy: Secondary | ICD-10-CM | POA: Diagnosis not present

## 2015-04-03 MED ORDER — NITROGLYCERIN 0.4 MG SL SUBL
0.4000 mg | SUBLINGUAL_TABLET | SUBLINGUAL | Status: AC | PRN
Start: 2015-04-03 — End: ?

## 2015-04-03 NOTE — Patient Instructions (Signed)
Medication Instructions: - Your physician recommends that you continue on your current medications as directed. Please refer to the Current Medication list given to you today.  Labwork: - none  Procedures/Testing: - none  Follow-Up: - Remote monitoring is used to monitor your Pacemaker of ICD from home. This monitoring reduces the number of office visits required to check your device to one time per year. It allows [place] to keep an eye on the functioning of your device to ensure it is working properly. You are scheduled for a device check from home on 07/03/15. You may send your transmission at any time that day. If you have a wireless device, the transmission will be sent automatically. After your physician reviews your transmission, you will receive a postcard with your next transmission date.  - Your physician wants you to follow-up in: November 2017 with Dr. [name]. You will receive a reminder letter in the mail two months in advance. If you don't receive a letter, please call our office to schedule the follow-up appointment.  Any Additional Special Instructions Will Be Listed Below (If Applicable).     If you need a refill on your cardiac medications before your next appointment, please call your pharmacy.

## 2015-04-04 LAB — CUP PACEART INCLINIC DEVICE CHECK
Brady Statistic AP VS Percent: 47.18 %
Brady Statistic AS VP Percent: 0.06 %
Brady Statistic AS VS Percent: 52.36 %
HighPow Impedance: 55 Ohm
HighPow Impedance: 65 Ohm
Implantable Lead Implant Date: 20090326
Implantable Lead Implant Date: 20090326
Implantable Lead Location: 753860
Implantable Lead Model: 185
Lead Channel Impedance Value: 456 Ohm
Lead Channel Impedance Value: 589 Ohm
Lead Channel Pacing Threshold Amplitude: 0.5 V
Lead Channel Pacing Threshold Pulse Width: 0.4 ms
Lead Channel Sensing Intrinsic Amplitude: 3.25 mV
Lead Channel Setting Pacing Amplitude: 2 V
Lead Channel Setting Pacing Amplitude: 2.5 V
Lead Channel Setting Pacing Pulse Width: 0.4 ms
MDC IDC LEAD LOCATION: 753859
MDC IDC LEAD SERIAL: 225056
MDC IDC MSMT BATTERY REMAINING LONGEVITY: 126 mo
MDC IDC MSMT BATTERY VOLTAGE: 3.12 V
MDC IDC MSMT LEADCHNL RA PACING THRESHOLD AMPLITUDE: 0.5 V
MDC IDC MSMT LEADCHNL RA PACING THRESHOLD PULSEWIDTH: 0.4 ms
MDC IDC MSMT LEADCHNL RV IMPEDANCE VALUE: 418 Ohm
MDC IDC MSMT LEADCHNL RV SENSING INTR AMPL: 17.375 mV
MDC IDC SESS DTM: 20170214163843
MDC IDC SET LEADCHNL RV SENSING SENSITIVITY: 0.6 mV
MDC IDC STAT BRADY AP VP PERCENT: 0.41 %
MDC IDC STAT BRADY RA PERCENT PACED: 47.59 %
MDC IDC STAT BRADY RV PERCENT PACED: 0.46 %

## 2015-05-16 ENCOUNTER — Encounter: Payer: Self-pay | Admitting: Internal Medicine

## 2015-05-17 ENCOUNTER — Encounter: Payer: Self-pay | Admitting: Cardiovascular Disease

## 2015-05-17 ENCOUNTER — Ambulatory Visit (INDEPENDENT_AMBULATORY_CARE_PROVIDER_SITE_OTHER): Payer: Commercial Managed Care - HMO | Admitting: Cardiovascular Disease

## 2015-05-17 VITALS — BP 120/72 | HR 61 | Ht 67.0 in | Wt 159.5 lb

## 2015-05-17 DIAGNOSIS — I10 Essential (primary) hypertension: Secondary | ICD-10-CM

## 2015-05-17 DIAGNOSIS — E118 Type 2 diabetes mellitus with unspecified complications: Secondary | ICD-10-CM

## 2015-05-17 DIAGNOSIS — I255 Ischemic cardiomyopathy: Secondary | ICD-10-CM

## 2015-05-17 DIAGNOSIS — Z95810 Presence of automatic (implantable) cardiac defibrillator: Secondary | ICD-10-CM

## 2015-05-17 DIAGNOSIS — I5022 Chronic systolic (congestive) heart failure: Secondary | ICD-10-CM

## 2015-05-17 DIAGNOSIS — I25719 Atherosclerosis of autologous vein coronary artery bypass graft(s) with unspecified angina pectoris: Secondary | ICD-10-CM | POA: Diagnosis not present

## 2015-05-17 DIAGNOSIS — E785 Hyperlipidemia, unspecified: Secondary | ICD-10-CM

## 2015-05-17 NOTE — Assessment & Plan Note (Signed)
Currently with no symptoms of angina. No further workup at this time. Continue current medication regimen. 

## 2015-05-17 NOTE — Assessment & Plan Note (Signed)
Recent dramatic weight loss, recommended he liberalize his diet, try to hold weight at 160 pounds Repeat lipid panel in May 2017, we'll decrease dose of Crestor if numbers are low

## 2015-05-17 NOTE — Assessment & Plan Note (Signed)
Suspect hemoglobin A1c will dramatically improve after recent weight loss. Encouraged him to liberalize his diet somewhat

## 2015-05-17 NOTE — Patient Instructions (Addendum)
You are doing well. No medication changes were made.  We can check lipids, LFT at the end of May  Please call us if you have new issues that need to be addressed before your next appt.  Your physician wants you to follow-up in: 6 months.  You will receive a reminder letter in the mail two months in advance. If you don't receive a letter, please call our office to schedule the follow-up appointment.

## 2015-05-17 NOTE — Progress Notes (Signed)
Patient ID: Paul Moore, male    DOB: 02/27/1950, 65 y.o.   MRN: [id number]  HPI Comments: Mr Guarino is a 65 year old gentleman with a history of smoking for 35 years, coronary artery disease, bypass x5 in July 2008, catheterization in December 2008 for recurrent chest pain with stents to his native RCA, occluded vein graft to the PDA, 60-70% proximal vein graft disease to the diagonal,  s/p ICD implantation for primary prevention, presents for routine followup of his coronary artery disease   Prior anginal symptoms included a sharp pain in his back.   Previously was going to move to [place], [place]  This did not work out, now living in [place]   Reports he is lost 20 pounds since last clinic visit, watching his diet closely, exercising more,  Feels well with no complaints  Wife is concerned he is losing too much weight  No recent lab work from the past 3 months  No chest pain on exertion   He is having back pain issues, worse when he bends over at the kitchen sink, worse when he puts his arms over his head and holds heavy items  Recent sharp chest pain right flank, stabbing then went away . Presented as he was reaching for something   He had ICD generator change, recovered well  EKG on today's visit shows nsr 61 bpm, Twave inversion V6 and I and AVL, V6   Other past medical history Chronic cough resolved by changing to losartan His wife has problems with Mnire's disease. She is incapacitated by her symptoms.  echocardiogram in 2009 shows ejection fraction 20-30%, mildly dilated left ventricle, moderately enlarged right ventricle with moderately decreased systolic function.   Last stress test in November 2008, treadmill where he exercised only for 3 minutes, 5 minutes. The large region of decreased perfusion in the inferior and lateral walls no ischemia, ejection fraction 22%.   Last catheterization December 2008 with a PTCA and stenting of the RCA, Promus stent 3.0 x 28  mm. occluded vein graft to the RCA at the ostium, severe diffuse native RCA disease with 90% proximal, 90% mid, high-grade PDA narrowing in a small vessel, moderate stenosis at the ostium of the vein graft to the diagonal, patent LIMA and patent vein graft to the circumflex  Total cholesterol in September 2014 was 148, LDL 84, glucose 127 Most recent cholesterol 109, LDL 53     Allergies  Allergen Reactions  . Beta Adrenergic Blockers Other (See Comments)    Fatigue, weakness, diarrhea  . Amiodarone Other (See Comments)    REACTION: Hives    Outpatient Encounter Prescriptions as of 05/17/2015  Medication Sig  . aspirin 81 MG tablet Take 1 tablet (81 mg total) by mouth daily.  . clopidogrel (PLAVIX) 75 MG tablet TAKE ONE TABLET BY MOUTH EVERY DAY  . furosemide (LASIX) 40 MG tablet TAKE 1 TABLET EVERY DAY  . isosorbide mononitrate (IMDUR) 30 MG 24 hr tablet TAKE TWO TABLETS (60 MG) BY MOUTH EVERY DAY AT BEDTIME  . losartan (COZAAR) 50 MG tablet Take 1 tablet (50 mg total) by mouth daily.  . nitroGLYCERIN (NITROSTAT) 0.4 MG SL tablet Place 1 tablet (0.4 mg total) under the tongue every 5 (five) minutes as needed for chest pain.  . potassium chloride SA (K-DUR,KLOR-CON) 20 MEQ tablet Take 1 tablet (20 mEq total) by mouth daily.  . rosuvastatin (CRESTOR) 40 MG tablet Take 1 tablet (40 mg total) by mouth daily.   No  facility-administered encounter medications on file as of 05/17/2015.    Past Medical History  Diagnosis Date  . Hypertension   . PVD (peripheral vascular disease) (HCC)     iliac stenosis  . Ischemic cardiomyopathy   . Dyslipidemia   . Coronary artery disease 30    MI age 36 yo s/p PCI  . Beta-blocker intolerance        . 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 Ex-smoker   . Diabetes mellitus type 2, controlled, with complications (HCC)     CAD  . Erectile dysfunction     failed PDE5 inhibitors, good results with pump    Past Surgical History  Procedure  Laterality Date  . Coronary artery bypass graft  2008    5v ([facility])  . Iliac artery stent Right     [facility]  . Cardiac defibrillator placement  2009    ICD in place [name])  . Percutaneous coronary stent intervention (pci-s)  2000  . Incision and drainage perirectal abscess    . Ep implantable device N/A 12/29/2014    Procedure:  ICD Generator Changeout;  Surgeon: [facility] Salvia, MD;  Location: [facility] INVASIVE CV LAB;  Service: Cardiovascular;  Laterality: N/A;    Social History  reports that he quit smoking about 9 years ago. His smoking use included Cigarettes. He smoked 1.00 pack per day. He has never used smokeless tobacco. He reports that he drinks alcohol. He reports that he does not use illicit drugs.  Family History family history includes CAD (age of onset: 37) in his father; CAD (age of onset: 14) in his mother; Diabetes in his paternal grandfather; Stroke in his mother. There is no history of Hypertension or Cancer.   Review of Systems  Constitutional: Negative.   Respiratory: Negative.   Cardiovascular: Negative.   Gastrointestinal: Negative.   Musculoskeletal: Positive for back pain.  Skin: Negative.   Neurological: Negative.   Hematological: Negative.   Psychiatric/Behavioral: Negative.   All other systems reviewed and are negative.   BP 120/72 mmHg  Pulse 61  Ht 5\' 7"  (1.702 m)  Wt 159 lb 8 oz (72.349 kg)  BMI 24.98 kg/m2  Physical Exam  Constitutional: He is oriented to person, place, and time. He appears well-developed and well-nourished.  HENT:  Head: Normocephalic.  Nose: Nose normal.  Mouth/Throat: Oropharynx is clear and moist.  Eyes: Conjunctivae are normal. Pupils are equal, round, and reactive to light.  Neck: Normal range of motion. Neck supple. No JVD present.  Cardiovascular: Normal rate, regular rhythm, S1 normal, S2 normal, normal heart sounds and intact distal pulses.  Exam reveals no gallop and no friction rub.   No murmur  heard. Pulmonary/Chest: Effort normal and breath sounds normal. No respiratory distress. He has no wheezes. He has no rales. He exhibits no tenderness.  Abdominal: Soft. Bowel sounds are normal. He exhibits no distension. There is no tenderness.  Musculoskeletal: Normal range of motion. He exhibits no edema or tenderness.  Lymphadenopathy:    He has no cervical adenopathy.  Neurological: He is alert and oriented to person, place, and time. Coordination normal.  Skin: Skin is warm and dry. No rash noted. No erythema.  Psychiatric: He has a normal mood and affect. His behavior is normal. Judgment and thought content normal.      Assessment and Plan   Nursing note and vitals reviewed.

## 2015-06-09 ENCOUNTER — Other Ambulatory Visit: Payer: Self-pay | Admitting: Cardiovascular Disease

## 2015-07-03 ENCOUNTER — Ambulatory Visit (INDEPENDENT_AMBULATORY_CARE_PROVIDER_SITE_OTHER): Payer: Commercial Managed Care - HMO | Admitting: *Deleted

## 2015-07-03 DIAGNOSIS — I255 Ischemic cardiomyopathy: Secondary | ICD-10-CM | POA: Diagnosis not present

## 2015-07-03 NOTE — Progress Notes (Signed)
Remote ICD transmission.   

## 2015-07-07 ENCOUNTER — Encounter: Payer: Self-pay | Admitting: Family Medicine

## 2015-07-07 ENCOUNTER — Other Ambulatory Visit: Payer: Self-pay | Admitting: Cardiovascular Disease

## 2015-07-13 ENCOUNTER — Other Ambulatory Visit (INDEPENDENT_AMBULATORY_CARE_PROVIDER_SITE_OTHER): Payer: Commercial Managed Care - HMO

## 2015-07-13 DIAGNOSIS — I5022 Chronic systolic (congestive) heart failure: Secondary | ICD-10-CM

## 2015-07-13 DIAGNOSIS — I25719 Atherosclerosis of autologous vein coronary artery bypass graft(s) with unspecified angina pectoris: Secondary | ICD-10-CM | POA: Diagnosis not present

## 2015-07-13 DIAGNOSIS — I10 Essential (primary) hypertension: Secondary | ICD-10-CM

## 2015-07-13 DIAGNOSIS — I255 Ischemic cardiomyopathy: Secondary | ICD-10-CM

## 2015-07-13 DIAGNOSIS — Z95810 Presence of automatic (implantable) cardiac defibrillator: Secondary | ICD-10-CM | POA: Diagnosis not present

## 2015-07-14 LAB — LIPID PANEL
Chol/HDL Ratio: 2.4 ratio units (ref 0.0–5.0)
Cholesterol, Total: 113 mg/dL (ref 100–199)
HDL: 48 mg/dL (ref >39)
LDL Calculated: 53 mg/dL (ref 0–99)
TRIGLYCERIDES: 62 mg/dL (ref 0–149)
VLDL CHOLESTEROL CAL: 12 mg/dL (ref 5–40)

## 2015-07-14 LAB — HEPATIC FUNCTION PANEL
ALBUMIN: 4.1 g/dL (ref 3.6–4.8)
ALT: 12 IU/L (ref 0–44)
AST: 21 IU/L (ref 0–40)
Alkaline Phosphatase: 80 IU/L (ref 39–117)
BILIRUBIN TOTAL: 0.8 mg/dL (ref 0.0–1.2)
Bilirubin, Direct: 0.23 mg/dL (ref 0.00–0.40)
Total Protein: 6.4 g/dL (ref 6.0–8.5)

## 2015-07-23 LAB — CUP PACEART REMOTE DEVICE CHECK
Battery Remaining Longevity: 123 mo
Battery Voltage: 3.07 V
Brady Statistic AP VP Percent: 0.71 %
Brady Statistic RA Percent Paced: 50.42 %
Brady Statistic RV Percent Paced: 0.79 %
HIGH POWER IMPEDANCE MEASURED VALUE: 55 Ohm
HighPow Impedance: 52 Ohm
Implantable Lead Implant Date: 20090326
Implantable Lead Location: 753860
Implantable Lead Model: 185
Implantable Lead Model: 5076
Implantable Lead Serial Number: 225056
Lead Channel Impedance Value: 475 Ohm
Lead Channel Pacing Threshold Amplitude: 0.5 V
Lead Channel Pacing Threshold Amplitude: 0.75 V
Lead Channel Pacing Threshold Pulse Width: 0.4 ms
Lead Channel Sensing Intrinsic Amplitude: 13.75 mV
Lead Channel Sensing Intrinsic Amplitude: 2.5 mV
Lead Channel Sensing Intrinsic Amplitude: 2.5 mV
Lead Channel Setting Pacing Amplitude: 2.5 V
MDC IDC LEAD IMPLANT DT: 20090326
MDC IDC LEAD LOCATION: 753859
MDC IDC MSMT LEADCHNL RA PACING THRESHOLD PULSEWIDTH: 0.4 ms
MDC IDC MSMT LEADCHNL RV IMPEDANCE VALUE: 399 Ohm
MDC IDC MSMT LEADCHNL RV IMPEDANCE VALUE: 418 Ohm
MDC IDC MSMT LEADCHNL RV SENSING INTR AMPL: 13.75 mV
MDC IDC SESS DTM: 20170516083824
MDC IDC SET LEADCHNL RA PACING AMPLITUDE: 2 V
MDC IDC SET LEADCHNL RV PACING PULSEWIDTH: 0.4 ms
MDC IDC SET LEADCHNL RV SENSING SENSITIVITY: 0.6 mV
MDC IDC STAT BRADY AP VS PERCENT: 49.7 %
MDC IDC STAT BRADY AS VP PERCENT: 0.08 %
MDC IDC STAT BRADY AS VS PERCENT: 49.5 %

## 2015-07-24 ENCOUNTER — Other Ambulatory Visit: Payer: Self-pay | Admitting: Cardiovascular Disease

## 2015-08-02 ENCOUNTER — Encounter: Payer: Self-pay | Admitting: Cardiology

## 2015-10-02 ENCOUNTER — Ambulatory Visit (INDEPENDENT_AMBULATORY_CARE_PROVIDER_SITE_OTHER): Payer: Commercial Managed Care - HMO | Admitting: *Deleted

## 2015-10-02 DIAGNOSIS — I255 Ischemic cardiomyopathy: Secondary | ICD-10-CM | POA: Diagnosis not present

## 2015-10-02 DIAGNOSIS — Z95810 Presence of automatic (implantable) cardiac defibrillator: Secondary | ICD-10-CM

## 2015-10-02 NOTE — Progress Notes (Signed)
Remote ICD transmission.   

## 2015-10-03 ENCOUNTER — Encounter: Payer: Self-pay | Admitting: Cardiology

## 2015-10-23 LAB — CUP PACEART REMOTE DEVICE CHECK
Brady Statistic AP VS Percent: 49.37 %
Brady Statistic AS VP Percent: 0.08 %
Brady Statistic RA Percent Paced: 49.97 %
HIGH POWER IMPEDANCE MEASURED VALUE: 63 Ohm
HighPow Impedance: 55 Ohm
Implantable Lead Implant Date: 20090326
Implantable Lead Implant Date: 20090326
Implantable Lead Location: 753859
Implantable Lead Model: 185
Implantable Lead Model: 5076
Lead Channel Impedance Value: 399 Ohm
Lead Channel Pacing Threshold Amplitude: 0.5 V
Lead Channel Pacing Threshold Amplitude: 0.625 V
Lead Channel Pacing Threshold Pulse Width: 0.4 ms
Lead Channel Sensing Intrinsic Amplitude: 2 mV
Lead Channel Setting Pacing Amplitude: 2.5 V
Lead Channel Setting Sensing Sensitivity: 0.6 mV
MDC IDC LEAD LOCATION: 753860
MDC IDC LEAD SERIAL: 225056
MDC IDC MSMT BATTERY REMAINING LONGEVITY: 121 mo
MDC IDC MSMT BATTERY VOLTAGE: 3.03 V
MDC IDC MSMT LEADCHNL RA IMPEDANCE VALUE: 513 Ohm
MDC IDC MSMT LEADCHNL RA SENSING INTR AMPL: 2 mV
MDC IDC MSMT LEADCHNL RV IMPEDANCE VALUE: 399 Ohm
MDC IDC MSMT LEADCHNL RV PACING THRESHOLD PULSEWIDTH: 0.4 ms
MDC IDC MSMT LEADCHNL RV SENSING INTR AMPL: 14 mV
MDC IDC MSMT LEADCHNL RV SENSING INTR AMPL: 14 mV
MDC IDC SESS DTM: 20170815113725
MDC IDC SET LEADCHNL RA PACING AMPLITUDE: 2 V
MDC IDC SET LEADCHNL RV PACING PULSEWIDTH: 0.4 ms
MDC IDC STAT BRADY AP VP PERCENT: 0.6 %
MDC IDC STAT BRADY AS VS PERCENT: 49.95 %
MDC IDC STAT BRADY RV PERCENT PACED: 0.67 %

## 2015-11-12 ENCOUNTER — Other Ambulatory Visit: Payer: Self-pay | Admitting: Cardiovascular Disease

## 2015-11-20 ENCOUNTER — Ambulatory Visit (INDEPENDENT_AMBULATORY_CARE_PROVIDER_SITE_OTHER): Payer: Commercial Managed Care - HMO

## 2015-11-20 DIAGNOSIS — Z23 Encounter for immunization: Secondary | ICD-10-CM

## 2015-11-26 ENCOUNTER — Ambulatory Visit (INDEPENDENT_AMBULATORY_CARE_PROVIDER_SITE_OTHER): Payer: Commercial Managed Care - HMO | Admitting: Cardiovascular Disease

## 2015-11-26 ENCOUNTER — Encounter: Payer: Self-pay | Admitting: Cardiovascular Disease

## 2015-11-26 VITALS — BP 116/72 | HR 65 | Ht 67.0 in | Wt 161.0 lb

## 2015-11-26 DIAGNOSIS — E7800 Pure hypercholesterolemia, unspecified: Secondary | ICD-10-CM

## 2015-11-26 DIAGNOSIS — I5022 Chronic systolic (congestive) heart failure: Secondary | ICD-10-CM

## 2015-11-26 DIAGNOSIS — I255 Ischemic cardiomyopathy: Secondary | ICD-10-CM

## 2015-11-26 DIAGNOSIS — I10 Essential (primary) hypertension: Secondary | ICD-10-CM

## 2015-11-26 DIAGNOSIS — E118 Type 2 diabetes mellitus with unspecified complications: Secondary | ICD-10-CM

## 2015-11-26 DIAGNOSIS — Z95810 Presence of automatic (implantable) cardiac defibrillator: Secondary | ICD-10-CM

## 2015-11-26 DIAGNOSIS — I25719 Atherosclerosis of autologous vein coronary artery bypass graft(s) with unspecified angina pectoris: Secondary | ICD-10-CM | POA: Diagnosis not present

## 2015-11-26 DIAGNOSIS — I739 Peripheral vascular disease, unspecified: Secondary | ICD-10-CM

## 2015-11-26 NOTE — Progress Notes (Signed)
Cardiology Office Note  Date:  11/26/2015   ID:  Paul Moore, DOB 1950/04/25, MRN [id number]  PCP:  Paul Boyden, MD   Chief Complaint  Patient presents with  . OTHER    6 month f/u c/o fatigue. Meds reviewed verbally with pt.    HPI:  Paul Moore is a 65 year old gentleman with a history of smoking for 35 years, coronary artery disease, bypass x5 in July 2008, catheterization in December 2008 for recurrent chest pain with stents to his native RCA, occluded vein graft to the PDA, 60-70% proximal vein graft disease to the diagonal,  s/p ICD implantation for primary prevention, presents for routine followup of his coronary artery disease   Prior anginal symptoms included a sharp pain in his back.  EF 30 to 35% in 2011  In follow-up, he reports that he is doing well Exercising several times per week, weight continues to stay low Good exercise tolerance, no chest pain or shortness of breath Continues to follow a strict diet  Lab work reviewed with him Total chol 113 LDL 53 HBA1C in 2016 6.4  He had ICD generator change, recovered well  EKG on today's visit shows nsr 65 bpm, Twave inversion V6 and I and AVL   Other past medical history Chronic cough resolved by changing to losartan His wife has problems with Mnire's disease. She is incapacitated by her symptoms.  echocardiogram in 2009 shows ejection fraction 20-30%, mildly dilated left ventricle, moderately enlarged right ventricle with moderately decreased systolic function.   Last stress test in November 2008, treadmill where he exercised only for 3 minutes, 5 minutes. The large region of decreased perfusion in the inferior and lateral walls no ischemia, ejection fraction 22%.   Last catheterization December 2008 with a PTCA and stenting of the RCA, Promus stent 3.0 x 28 mm. occluded vein graft to the RCA at the ostium, severe diffuse native RCA disease with 90% proximal, 90% mid, high-grade PDA narrowing in a small  vessel, moderate stenosis at the ostium of the vein graft to the diagonal, patent LIMA and patent vein graft to the circumflex  Total cholesterol in September 2014 was 148, LDL 84, glucose 127 Most recent cholesterol 109, LDL 53   PMH:   has a past medical history of Beta-blocker intolerance; Coronary artery disease (2000); Diabetes mellitus type 2, controlled, with complications (HCC); Dyslipidemia; Erectile dysfunction; Ex-smoker; Hypertension; ICD-dual-chamber-Medtronic (05/16/2009); Ischemic cardiomyopathy; and PVD (peripheral vascular disease) (HCC).  PSH:    Past Surgical History:  Procedure Laterality Date  . CARDIAC DEFIBRILLATOR PLACEMENT  2009   ICD in place Paul Moore)  . CORONARY ARTERY BYPASS GRAFT  2008   5v (Paul Moore)  . EP IMPLANTABLE DEVICE N/A 12/29/2014   Procedure:  ICD Generator Changeout;  Surgeon: Paul Salvia, MD;  Location: [facility] INVASIVE CV LAB;  Service: Cardiovascular;  Laterality: N/A;  . ILIAC ARTERY STENT Right    Paul  . INCISION AND DRAINAGE PERIRECTAL ABSCESS    . PERCUTANEOUS CORONARY STENT INTERVENTION (PCI-S)  2000    Current Outpatient Prescriptions  Medication Sig Dispense Refill  . aspirin 81 MG tablet Take 1 tablet (81 mg total) by mouth daily. 30 tablet 0  . clopidogrel (PLAVIX) 75 MG tablet TAKE ONE TABLET BY MOUTH EVERY DAY 90 tablet 3  . furosemide (LASIX) 40 MG tablet TAKE 1 TABLET EVERY DAY 90 tablet 3  . isosorbide mononitrate (IMDUR) 30 MG 24 hr tablet TAKE 2 TABLETS EVERY DAY AT BEDTIME 180 tablet 3  .  losartan (COZAAR) 50 MG tablet TAKE 1 TABLET EVERY DAY 90 tablet 3  . nitroGLYCERIN (NITROSTAT) 0.4 MG SL tablet Place 1 tablet (0.4 mg total) under the tongue every 5 (five) minutes as needed for chest pain. 25 tablet 3  . potassium chloride SA (K-DUR,KLOR-CON) 20 MEQ tablet TAKE 1 TABLET EVERY DAY 90 tablet 3  . rosuvastatin (CRESTOR) 40 MG tablet TAKE 1 TABLET EVERY DAY 90 tablet 3   No current facility-administered medications for  this visit.      Allergies:   Beta adrenergic blockers and Amiodarone   Social History:  The patient  reports that he quit smoking about 9 years ago. His smoking use included Cigarettes. He smoked 1.00 pack per day. He has never used smokeless tobacco. He reports that he drinks alcohol. He reports that he does not use drugs.   Family History:   family history includes CAD (age of onset: 69) in his father; CAD (age of onset: 36) in his mother; Diabetes in his paternal grandfather; Stroke in his mother.    Review of Systems: Review of Systems  Constitutional: Negative.   Respiratory: Negative.   Cardiovascular: Negative.   Gastrointestinal: Negative.   Musculoskeletal: Negative.   Neurological: Negative.   Psychiatric/Behavioral: Negative.   All other systems reviewed and are negative.    PHYSICAL EXAM: VS:  BP 116/72 (BP Location: Left Arm, Patient Position: Sitting, Cuff Size: Normal)   Pulse 65   Ht 5\' 7"  (1.702 m)   Wt 161 lb (73 kg)   BMI 25.22 kg/m  , BMI Body mass index is 25.22 kg/m. GEN: Well nourished, well developed, in no acute distress  HEENT: normal  Neck: no JVD, carotid bruits, or masses Cardiac: RRR; no murmurs, rubs, or gallops,no edema  Respiratory:  clear to auscultation bilaterally, normal work of breathing GI: soft, nontender, nondistended, + BS MS: no deformity or atrophy  Skin: warm and dry, no rash Neuro:  Strength and sensation are intact Psych: euthymic mood, full affect    Recent Labs: 12/22/2014: BUN 10; Creatinine, Ser 0.94; Platelets 210; Potassium 4.1; Sodium 140 07/13/2015: ALT 12    Lipid Panel Lab Results  Component Value Date   CHOL 113 07/13/2015   HDL 48 07/13/2015   LDLCALC 53 07/13/2015   TRIG 62 07/13/2015      Wt Readings from Last 3 Encounters:  11/26/15 161 lb (73 kg)  05/17/15 159 lb 8 oz (72.3 kg)  04/03/15 162 lb 8 oz (73.7 kg)       ASSESSMENT AND PLAN:  Pure hypercholesterolemia - Plan: EKG  12-Lead Cholesterol is at goal on the current lipid regimen. No changes to the medications were made.  Essential hypertension - Plan: EKG 12-Lead Blood pressure is well controlled on today's visit. No changes made to the medications.  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 No further workup at this time. Continue current medication regimen.  Cardiomyopathy, ischemic - Plan: EKG 12-Lead  SYSTOLIC HEART FAILURE, CHRONIC - Plan: EKG 12-Lead  PVD (peripheral vascular disease) (HCC)  ICD (implantable cardioverter-defibrillator), dual, in situ - Plan: EKG 12-Lead Followed by Dr. Graciela Moore, recent generator change  Controlled type 2 diabetes mellitus with complication, without long-term current use of insulin (HCC)    Total encounter time more than 15 minutes  Greater than 50% was spent in counseling and coordination of care with the patient  Disposition:   F/U  6 months   Orders Placed This Encounter  Procedures  . EKG 12-Lead     Signed, Dossie Arbourim Ivelis Norgard, M.D., Ph.D. 11/26/2015  [facility] [place] [phone]

## 2015-11-26 NOTE — Patient Instructions (Signed)

## 2016-01-02 ENCOUNTER — Ambulatory Visit: Payer: Commercial Managed Care - HMO

## 2016-01-02 ENCOUNTER — Telehealth: Payer: Self-pay | Admitting: Cardiovascular Disease

## 2016-01-02 NOTE — Telephone Encounter (Signed)
Pt calling asking if it would be okay to take some mucinex  since he has a cold  Would like to make sure it is okay to take with all the other meds he is taking Please advise

## 2016-01-02 NOTE — Telephone Encounter (Signed)
Patient called in wanting to know if he could try guaifenesin due to some chest congestion. Let him know that he could try it to see if it helps and that he should just take caution if taking some of the other cold remedies due to his blood pressure. Let him know that I would send to Dr. Mariah Milling and if he has anything to add I would give him a call back. He was appreciative for the call and had no further questions at this time.

## 2016-01-08 ENCOUNTER — Other Ambulatory Visit: Payer: Commercial Managed Care - HMO

## 2016-01-08 ENCOUNTER — Ambulatory Visit (INDEPENDENT_AMBULATORY_CARE_PROVIDER_SITE_OTHER): Payer: Commercial Managed Care - HMO | Admitting: Internal Medicine

## 2016-01-08 ENCOUNTER — Encounter: Payer: Self-pay | Admitting: Internal Medicine

## 2016-01-08 VITALS — BP 128/60 | HR 68 | Ht 67.0 in | Wt 167.8 lb

## 2016-01-08 DIAGNOSIS — Z95810 Presence of automatic (implantable) cardiac defibrillator: Secondary | ICD-10-CM

## 2016-01-08 DIAGNOSIS — I255 Ischemic cardiomyopathy: Secondary | ICD-10-CM | POA: Diagnosis not present

## 2016-01-08 DIAGNOSIS — I5022 Chronic systolic (congestive) heart failure: Secondary | ICD-10-CM

## 2016-01-08 DIAGNOSIS — I495 Sick sinus syndrome: Secondary | ICD-10-CM

## 2016-01-08 NOTE — Patient Instructions (Addendum)

## 2016-01-08 NOTE — Progress Notes (Signed)
Patient Care Team: Eustaquio Boyden, MD as PCP - General (Family Medicine) Antonieta Iba, MD as Consulting Physician (Cardiology) [facility] Salvia, MD as Consulting Physician (Cardiology)   HPI  Paul Moore is a 65 y.o. male Seen in followup for ICD implanted for primary prevention in the setting of coronary artery disease, bypass x5 in July 2009;  ] He was to move to [place]. His device reached ERI and he underwent generator replacement 11/16  He has done well  The patient denies chest pain, shortness of breath, nocturnal dyspnea, orthopnea or peripheral edema.  There have been no palpitations or syncope. He did  have a spell where he became acutely lightheaded. He been standing. He laid down on the sofa. The symptoms abated very rapidly. He had no associated palpitations. Device interrogation demonstrated no associated arrhythmia  Echocardiogram in 2011 demonstrated LV dysfunction EF 30-35% with inferolateral septal and apical wall motion abnormalities.  Last stress test in November 2008, treadmill where he exercised only for 3 minutes, 5 minutes. The large region of decreased perfusion in the inferior and lateral walls no ischemia, ejection fraction 22%.  Last catheterization December 2008 with a PTCA and stenting of the RCA, Promus stent 3.0 x 28 mm. occluded vein graft to the RCA at the ostium, severe diffuse native RCA disease with 90% proximal, 90% mid, high-grade PDA narrowing in a small vessel, moderate stenosis at the ostium of the vein graft to the diagonal, patent LIMA and patent vein graft to the circumflex   His wife is incapacitated by Mnire's disease   Past Medical History:  Diagnosis Date  . Beta-blocker intolerance       . Coronary artery disease 37   MI age 89 yo s/p PCI  . Diabetes mellitus type 2, controlled, with complications (HCC)    CAD  . Dyslipidemia   . Erectile dysfunction    failed PDE5 inhibitors, good results with pump  . Ex-smoker   .  Hypertension   . 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 Ischemic cardiomyopathy   . PVD (peripheral vascular disease) (HCC)    iliac stenosis    Past Surgical History:  Procedure Laterality Date  . CARDIAC DEFIBRILLATOR PLACEMENT  2009   ICD in place [name])  . CORONARY ARTERY BYPASS GRAFT  2008   5v ([facility])  . EP IMPLANTABLE DEVICE N/A 12/29/2014   Procedure:  ICD Generator Changeout;  Surgeon: Duke Salvia, MD;  Location: [facility] INVASIVE CV LAB;  Service: Cardiovascular;  Laterality: N/A;  . ILIAC ARTERY STENT Right    [facility]  . INCISION AND DRAINAGE PERIRECTAL ABSCESS    . PERCUTANEOUS CORONARY STENT INTERVENTION (PCI-S)  2000    Current Outpatient Prescriptions  Medication Sig Dispense Refill  . aspirin 81 MG tablet Take 1 tablet (81 mg total) by mouth daily. 30 tablet 0  . clopidogrel (PLAVIX) 75 MG tablet TAKE ONE TABLET BY MOUTH EVERY DAY 90 tablet 3  . furosemide (LASIX) 40 MG tablet TAKE 1 TABLET EVERY DAY 90 tablet 3  . isosorbide mononitrate (IMDUR) 30 MG 24 hr tablet TAKE 2 TABLETS EVERY DAY AT BEDTIME 180 tablet 3  . losartan (COZAAR) 50 MG tablet TAKE 1 TABLET EVERY DAY 90 tablet 3  . nitroGLYCERIN (NITROSTAT) 0.4 MG SL tablet Place 1 tablet (0.4 mg total) under the tongue every 5 (five) minutes as needed for chest pain. 25 tablet 3  . potassium chloride SA (K-DUR,KLOR-CON) 20 MEQ tablet TAKE 1 TABLET EVERY DAY 90 tablet 3  .  rosuvastatin (CRESTOR) 40 MG tablet TAKE 1 TABLET EVERY DAY 90 tablet 3   No current facility-administered medications for this visit.     Allergies  Allergen Reactions  . Beta Adrenergic Blockers Other (See Comments)    Fatigue, weakness, diarrhea  . Amiodarone Other (See Comments)    REACTION: Hives    Review of Systems negative except from HPI and PMH  Physical Exam BP 128/60 (BP Location: Left Arm, Patient Position: Sitting, Cuff Size: Normal)   Pulse 68   Ht 5\' 7"  (1.702 m)   Wt 167 lb 12 oz (76.1 kg)   BMI 26.27 kg/m  Well  developed and well nourished in no acute distress HENT normal E scleral and icterus clear Neck Supple JVP flat; carotids brisk and full Clear to ausculation Device pocket well healed; without hematoma or erythema.  There is no tethering Regular rate and rhythm, no murmurs gallops or rub Soft with active bowel sounds No clubbing cyanosis none Edema Alert and oriented, grossly normal motor and sensory function Skin Warm and Dry  ECG demonstrates atrial pacing @ 68 Intervals 25/10/39 Axis left -36     Assessment and  Plan  Sinus node dysfunction  Ischemic cardiomyopathy  Lightheadedness/presyncope  Congestive heart failure-chronic-systolic-class II  Implantable defibrillator-Medtronic dual chamber The patient's device was interrogated.  The information was reviewed. No changes were made in the programming.  The presyncopal spell does not correlate without any recorded arrhythmia  There is a possibility, esp given the alacrity of onset and offset that it represented VT below the detection.  And we should program on a monitor zone.  He is euvolemic; continue current medications  Without symptoms of ischemia; continue ASA plavix  Reasonable heart rate excursion

## 2016-01-14 ENCOUNTER — Encounter: Payer: Commercial Managed Care - HMO | Admitting: Family Medicine

## 2016-01-17 ENCOUNTER — Encounter: Payer: Self-pay | Admitting: Internal Medicine

## 2016-03-03 ENCOUNTER — Other Ambulatory Visit: Payer: Self-pay | Admitting: Cardiovascular Disease

## 2016-03-06 LAB — CUP PACEART INCLINIC DEVICE CHECK
Brady Statistic AP VP Percent: 0.79 %
Brady Statistic AS VP Percent: 0.11 %
Brady Statistic RA Percent Paced: 49.65 %
Brady Statistic RV Percent Paced: 1.01 %
HIGH POWER IMPEDANCE MEASURED VALUE: 57 Ohm
HIGH POWER IMPEDANCE MEASURED VALUE: 62 Ohm
Implantable Lead Implant Date: 20090326
Implantable Lead Location Detail 1: HIGH
Implantable Lead Location: 753859
Implantable Lead Model: 185
Implantable Lead Serial Number: 225056
Lead Channel Impedance Value: 399 Ohm
Lead Channel Impedance Value: 399 Ohm
Lead Channel Pacing Threshold Amplitude: 0.75 V
Lead Channel Sensing Intrinsic Amplitude: 18.625 mV
Lead Channel Setting Pacing Amplitude: 2 V
Lead Channel Setting Sensing Sensitivity: 0.6 mV
MDC IDC LEAD IMPLANT DT: 20090326
MDC IDC LEAD LOCATION: 753860
MDC IDC MSMT BATTERY REMAINING LONGEVITY: 119 mo
MDC IDC MSMT BATTERY VOLTAGE: 3.02 V
MDC IDC MSMT LEADCHNL RA IMPEDANCE VALUE: 513 Ohm
MDC IDC MSMT LEADCHNL RA PACING THRESHOLD AMPLITUDE: 0.5 V
MDC IDC MSMT LEADCHNL RA PACING THRESHOLD PULSEWIDTH: 0.4 ms
MDC IDC MSMT LEADCHNL RA SENSING INTR AMPL: 2.5 mV
MDC IDC MSMT LEADCHNL RV PACING THRESHOLD PULSEWIDTH: 0.4 ms
MDC IDC PG IMPLANT DT: 20161111
MDC IDC SESS DTM: 20171121161251
MDC IDC SET LEADCHNL RV PACING AMPLITUDE: 2.5 V
MDC IDC SET LEADCHNL RV PACING PULSEWIDTH: 0.4 ms
MDC IDC STAT BRADY AP VS PERCENT: 49.9 %
MDC IDC STAT BRADY AS VS PERCENT: 49.2 %

## 2016-03-10 ENCOUNTER — Telehealth: Payer: Self-pay | Admitting: Cardiovascular Disease

## 2016-03-10 NOTE — Telephone Encounter (Signed)
Spoke with patient and he states that he needs forms filled out because he has moved and not yet established with new providers. Let him know that I was not sure if we would be able to assist with these forms but that he could send them and I would make Dr. Windell Hummingbird nurse aware. He was appreciative for the call back and had no further questions at this time.

## 2016-03-10 NOTE — Telephone Encounter (Signed)
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

## 2016-03-20 ENCOUNTER — Telehealth: Payer: Self-pay | Admitting: Internal Medicine

## 2016-03-20 NOTE — Telephone Encounter (Signed)
Spoke w/ pt and he informed me that he has moved to [place] but has not found a new cardiologist or a new EP. He has reached out to his insurance company and they said they would cover remote transmission on 04-08-16 and gave him a list of MD's he can see in his new area. Pt knows what once he gets a new MD they will request his information from [place] and we will release it to them. Also informed pt that a lady in billing dept will call him on 04-08-16 to go over insurance information to make sure it will pay as well. Pt verbalized understanding.

## 2016-03-20 NOTE — Telephone Encounter (Signed)
Pt calling having some questions about his transmission he is to send on feb 20  Please call back

## 2016-04-06 ENCOUNTER — Other Ambulatory Visit: Payer: Self-pay | Admitting: Cardiovascular Disease

## 2016-04-08 ENCOUNTER — Ambulatory Visit (INDEPENDENT_AMBULATORY_CARE_PROVIDER_SITE_OTHER): Payer: Medicare PPO | Admitting: *Deleted

## 2016-04-08 DIAGNOSIS — I5022 Chronic systolic (congestive) heart failure: Secondary | ICD-10-CM

## 2016-04-08 DIAGNOSIS — I255 Ischemic cardiomyopathy: Secondary | ICD-10-CM | POA: Diagnosis not present

## 2016-04-08 NOTE — Progress Notes (Signed)
Remote ICD transmission.   

## 2016-04-09 ENCOUNTER — Encounter: Payer: Self-pay | Admitting: Cardiology

## 2016-04-09 LAB — CUP PACEART REMOTE DEVICE CHECK
Brady Statistic AP VP Percent: 0.33 %
Brady Statistic AP VS Percent: 42.76 %
Brady Statistic AS VP Percent: 0.06 %
Brady Statistic RA Percent Paced: 42.48 %
Brady Statistic RV Percent Paced: 0.46 %
Date Time Interrogation Session: 20180220083523
HIGH POWER IMPEDANCE MEASURED VALUE: 59 Ohm
HighPow Impedance: 55 Ohm
Implantable Lead Implant Date: 20090326
Implantable Lead Location: 753860
Implantable Lead Model: 185
Implantable Lead Serial Number: 225056
Lead Channel Impedance Value: 399 Ohm
Lead Channel Impedance Value: 475 Ohm
Lead Channel Pacing Threshold Amplitude: 0.625 V
Lead Channel Pacing Threshold Pulse Width: 0.4 ms
Lead Channel Sensing Intrinsic Amplitude: 15 mV
Lead Channel Sensing Intrinsic Amplitude: 15 mV
Lead Channel Sensing Intrinsic Amplitude: 2.875 mV
Lead Channel Setting Pacing Amplitude: 2 V
Lead Channel Setting Pacing Amplitude: 2.5 V
Lead Channel Setting Pacing Pulse Width: 0.4 ms
Lead Channel Setting Sensing Sensitivity: 0.6 mV
MDC IDC LEAD IMPLANT DT: 20090326
MDC IDC LEAD LOCATION DETAIL 1: HIGH
MDC IDC LEAD LOCATION: 753859
MDC IDC MSMT BATTERY REMAINING LONGEVITY: 116 mo
MDC IDC MSMT BATTERY VOLTAGE: 3.02 V
MDC IDC MSMT LEADCHNL RA PACING THRESHOLD AMPLITUDE: 0.5 V
MDC IDC MSMT LEADCHNL RA PACING THRESHOLD PULSEWIDTH: 0.4 ms
MDC IDC MSMT LEADCHNL RA SENSING INTR AMPL: 2.875 mV
MDC IDC MSMT LEADCHNL RV IMPEDANCE VALUE: 399 Ohm
MDC IDC PG IMPLANT DT: 20161111
MDC IDC STAT BRADY AS VS PERCENT: 56.85 %

## 2016-04-15 ENCOUNTER — Telehealth: Payer: Self-pay | Admitting: Cardiovascular Disease

## 2016-04-15 NOTE — Telephone Encounter (Signed)
Received records request [place] , forwarded to [facility] for processing.

## 2016-05-17 ENCOUNTER — Other Ambulatory Visit: Payer: Self-pay | Admitting: Cardiovascular Disease

## 2016-07-08 ENCOUNTER — Ambulatory Visit (INDEPENDENT_AMBULATORY_CARE_PROVIDER_SITE_OTHER): Payer: Medicare PPO | Admitting: *Deleted

## 2016-07-08 DIAGNOSIS — I255 Ischemic cardiomyopathy: Secondary | ICD-10-CM

## 2016-07-08 DIAGNOSIS — I5022 Chronic systolic (congestive) heart failure: Secondary | ICD-10-CM

## 2016-07-08 NOTE — Progress Notes (Signed)
Remote ICD transmission.   

## 2016-07-09 ENCOUNTER — Encounter: Payer: Self-pay | Admitting: Cardiology

## 2016-07-10 LAB — CUP PACEART REMOTE DEVICE CHECK
Battery Remaining Longevity: 112 mo
Brady Statistic AP VP Percent: 0.17 %
Brady Statistic AP VS Percent: 46.95 %
Brady Statistic AS VP Percent: 0.03 %
Brady Statistic RA Percent Paced: 46.75 %
HIGH POWER IMPEDANCE MEASURED VALUE: 60 Ohm
HIGH POWER IMPEDANCE MEASURED VALUE: 63 Ohm
Implantable Lead Implant Date: 20090326
Implantable Lead Implant Date: 20090326
Implantable Lead Location Detail 1: HIGH
Implantable Lead Location: 753859
Implantable Lead Model: 185
Lead Channel Impedance Value: 418 Ohm
Lead Channel Pacing Threshold Amplitude: 0.5 V
Lead Channel Pacing Threshold Pulse Width: 0.4 ms
Lead Channel Setting Sensing Sensitivity: 0.6 mV
MDC IDC LEAD LOCATION: 753860
MDC IDC LEAD SERIAL: 225056
MDC IDC MSMT BATTERY VOLTAGE: 3.01 V
MDC IDC MSMT LEADCHNL RA IMPEDANCE VALUE: 456 Ohm
MDC IDC MSMT LEADCHNL RA SENSING INTR AMPL: 2.75 mV
MDC IDC MSMT LEADCHNL RA SENSING INTR AMPL: 2.75 mV
MDC IDC MSMT LEADCHNL RV IMPEDANCE VALUE: 418 Ohm
MDC IDC MSMT LEADCHNL RV PACING THRESHOLD AMPLITUDE: 0.625 V
MDC IDC MSMT LEADCHNL RV PACING THRESHOLD PULSEWIDTH: 0.4 ms
MDC IDC MSMT LEADCHNL RV SENSING INTR AMPL: 14.5 mV
MDC IDC MSMT LEADCHNL RV SENSING INTR AMPL: 14.5 mV
MDC IDC PG IMPLANT DT: 20161111
MDC IDC SESS DTM: 20180522052503
MDC IDC SET LEADCHNL RA PACING AMPLITUDE: 2 V
MDC IDC SET LEADCHNL RV PACING AMPLITUDE: 2.5 V
MDC IDC SET LEADCHNL RV PACING PULSEWIDTH: 0.4 ms
MDC IDC STAT BRADY AS VS PERCENT: 52.85 %
MDC IDC STAT BRADY RV PERCENT PACED: 0.23 %

## 2016-07-15 ENCOUNTER — Other Ambulatory Visit: Payer: Self-pay | Admitting: Cardiovascular Disease

## 2016-11-22 DIAGNOSIS — Z23 Encounter for immunization: Secondary | ICD-10-CM | POA: Diagnosis not present

## 2016-11-24 DIAGNOSIS — I429 Cardiomyopathy, unspecified: Secondary | ICD-10-CM | POA: Diagnosis not present

## 2016-12-07 ENCOUNTER — Other Ambulatory Visit: Payer: Self-pay | Admitting: Cardiovascular Disease

## 2017-03-02 DIAGNOSIS — I429 Cardiomyopathy, unspecified: Secondary | ICD-10-CM | POA: Diagnosis not present

## 2017-03-15 ENCOUNTER — Other Ambulatory Visit: Payer: Self-pay | Admitting: Cardiovascular Disease

## 2017-06-08 DIAGNOSIS — I429 Cardiomyopathy, unspecified: Secondary | ICD-10-CM | POA: Diagnosis not present

## 2017-08-05 DIAGNOSIS — I2510 Atherosclerotic heart disease of native coronary artery without angina pectoris: Secondary | ICD-10-CM | POA: Diagnosis not present

## 2017-08-05 DIAGNOSIS — I255 Ischemic cardiomyopathy: Secondary | ICD-10-CM | POA: Diagnosis not present

## 2017-08-05 DIAGNOSIS — Z95810 Presence of automatic (implantable) cardiac defibrillator: Secondary | ICD-10-CM | POA: Diagnosis not present

## 2017-08-24 DIAGNOSIS — I429 Cardiomyopathy, unspecified: Secondary | ICD-10-CM | POA: Diagnosis not present

## 2017-09-03 DIAGNOSIS — Z95810 Presence of automatic (implantable) cardiac defibrillator: Secondary | ICD-10-CM | POA: Diagnosis not present

## 2017-09-03 DIAGNOSIS — I2510 Atherosclerotic heart disease of native coronary artery without angina pectoris: Secondary | ICD-10-CM | POA: Diagnosis not present

## 2017-09-03 DIAGNOSIS — I255 Ischemic cardiomyopathy: Secondary | ICD-10-CM | POA: Diagnosis not present

## 2017-11-30 DIAGNOSIS — I429 Cardiomyopathy, unspecified: Secondary | ICD-10-CM | POA: Diagnosis not present

## 2017-12-10 DIAGNOSIS — Z23 Encounter for immunization: Secondary | ICD-10-CM | POA: Diagnosis not present

## 2018-03-08 DIAGNOSIS — I429 Cardiomyopathy, unspecified: Secondary | ICD-10-CM | POA: Diagnosis not present

## 2018-06-14 DIAGNOSIS — I429 Cardiomyopathy, unspecified: Secondary | ICD-10-CM | POA: Diagnosis not present

## 2018-08-04 DIAGNOSIS — I2510 Atherosclerotic heart disease of native coronary artery without angina pectoris: Secondary | ICD-10-CM | POA: Diagnosis not present

## 2018-08-04 DIAGNOSIS — Z95810 Presence of automatic (implantable) cardiac defibrillator: Secondary | ICD-10-CM | POA: Diagnosis not present

## 2018-08-04 DIAGNOSIS — E7801 Familial hypercholesterolemia: Secondary | ICD-10-CM | POA: Diagnosis not present

## 2018-08-04 DIAGNOSIS — I10 Essential (primary) hypertension: Secondary | ICD-10-CM | POA: Diagnosis not present

## 2018-08-30 DIAGNOSIS — I2510 Atherosclerotic heart disease of native coronary artery without angina pectoris: Secondary | ICD-10-CM | POA: Diagnosis not present

## 2018-08-30 DIAGNOSIS — I429 Cardiomyopathy, unspecified: Secondary | ICD-10-CM | POA: Diagnosis not present

## 2018-12-06 DIAGNOSIS — I429 Cardiomyopathy, unspecified: Secondary | ICD-10-CM | POA: Diagnosis not present

## 2018-12-11 DIAGNOSIS — Z23 Encounter for immunization: Secondary | ICD-10-CM | POA: Diagnosis not present

## 2018-12-29 DIAGNOSIS — Z95810 Presence of automatic (implantable) cardiac defibrillator: Secondary | ICD-10-CM | POA: Diagnosis not present

## 2018-12-29 DIAGNOSIS — I499 Cardiac arrhythmia, unspecified: Secondary | ICD-10-CM | POA: Diagnosis not present

## 2018-12-29 DIAGNOSIS — H6123 Impacted cerumen, bilateral: Secondary | ICD-10-CM | POA: Diagnosis not present

## 2019-01-05 DIAGNOSIS — I10 Essential (primary) hypertension: Secondary | ICD-10-CM | POA: Diagnosis not present

## 2019-01-05 DIAGNOSIS — I2510 Atherosclerotic heart disease of native coronary artery without angina pectoris: Secondary | ICD-10-CM | POA: Diagnosis not present

## 2019-01-05 DIAGNOSIS — R42 Dizziness and giddiness: Secondary | ICD-10-CM | POA: Diagnosis not present

## 2019-01-07 DIAGNOSIS — R42 Dizziness and giddiness: Secondary | ICD-10-CM | POA: Diagnosis not present

## 2019-01-20 DIAGNOSIS — R42 Dizziness and giddiness: Secondary | ICD-10-CM | POA: Diagnosis not present

## 2019-03-14 DIAGNOSIS — I429 Cardiomyopathy, unspecified: Secondary | ICD-10-CM | POA: Diagnosis not present

## 2019-06-20 DIAGNOSIS — I429 Cardiomyopathy, unspecified: Secondary | ICD-10-CM | POA: Diagnosis not present

## 2019-08-03 DIAGNOSIS — E785 Hyperlipidemia, unspecified: Secondary | ICD-10-CM | POA: Diagnosis not present

## 2019-08-03 DIAGNOSIS — I10 Essential (primary) hypertension: Secondary | ICD-10-CM | POA: Diagnosis not present

## 2019-08-03 DIAGNOSIS — Z95810 Presence of automatic (implantable) cardiac defibrillator: Secondary | ICD-10-CM | POA: Diagnosis not present

## 2019-08-03 DIAGNOSIS — I2510 Atherosclerotic heart disease of native coronary artery without angina pectoris: Secondary | ICD-10-CM | POA: Diagnosis not present

## 2019-08-23 DIAGNOSIS — I10 Essential (primary) hypertension: Secondary | ICD-10-CM | POA: Diagnosis not present

## 2019-08-23 DIAGNOSIS — Z95810 Presence of automatic (implantable) cardiac defibrillator: Secondary | ICD-10-CM | POA: Diagnosis not present

## 2019-08-23 DIAGNOSIS — E785 Hyperlipidemia, unspecified: Secondary | ICD-10-CM | POA: Diagnosis not present

## 2019-08-23 DIAGNOSIS — I2510 Atherosclerotic heart disease of native coronary artery without angina pectoris: Secondary | ICD-10-CM | POA: Diagnosis not present

## 2019-09-05 DIAGNOSIS — I429 Cardiomyopathy, unspecified: Secondary | ICD-10-CM | POA: Diagnosis not present

## 2019-10-29 DIAGNOSIS — Z23 Encounter for immunization: Secondary | ICD-10-CM | POA: Diagnosis not present

## 2019-12-12 DIAGNOSIS — I429 Cardiomyopathy, unspecified: Secondary | ICD-10-CM | POA: Diagnosis not present

## 2020-01-08 DIAGNOSIS — N132 Hydronephrosis with renal and ureteral calculous obstruction: Secondary | ICD-10-CM | POA: Diagnosis not present

## 2020-01-08 DIAGNOSIS — I10 Essential (primary) hypertension: Secondary | ICD-10-CM | POA: Diagnosis not present

## 2020-01-08 DIAGNOSIS — K4020 Bilateral inguinal hernia, without obstruction or gangrene, not specified as recurrent: Secondary | ICD-10-CM | POA: Diagnosis not present

## 2020-01-08 DIAGNOSIS — R079 Chest pain, unspecified: Secondary | ICD-10-CM | POA: Diagnosis not present

## 2020-01-08 DIAGNOSIS — N400 Enlarged prostate without lower urinary tract symptoms: Secondary | ICD-10-CM | POA: Diagnosis not present

## 2020-01-08 DIAGNOSIS — R110 Nausea: Secondary | ICD-10-CM | POA: Diagnosis not present

## 2020-01-08 DIAGNOSIS — D72829 Elevated white blood cell count, unspecified: Secondary | ICD-10-CM | POA: Diagnosis not present

## 2020-01-08 DIAGNOSIS — N201 Calculus of ureter: Secondary | ICD-10-CM | POA: Diagnosis not present

## 2020-01-08 DIAGNOSIS — R1032 Left lower quadrant pain: Secondary | ICD-10-CM | POA: Diagnosis not present

## 2020-01-08 DIAGNOSIS — K4021 Bilateral inguinal hernia, without obstruction or gangrene, recurrent: Secondary | ICD-10-CM | POA: Diagnosis not present

## 2020-01-08 DIAGNOSIS — Z9889 Other specified postprocedural states: Secondary | ICD-10-CM | POA: Diagnosis not present

## 2020-01-08 DIAGNOSIS — I2510 Atherosclerotic heart disease of native coronary artery without angina pectoris: Secondary | ICD-10-CM | POA: Diagnosis not present

## 2020-01-08 DIAGNOSIS — K8020 Calculus of gallbladder without cholecystitis without obstruction: Secondary | ICD-10-CM | POA: Diagnosis not present

## 2020-01-09 DIAGNOSIS — N400 Enlarged prostate without lower urinary tract symptoms: Secondary | ICD-10-CM | POA: Diagnosis not present

## 2020-01-09 DIAGNOSIS — N132 Hydronephrosis with renal and ureteral calculous obstruction: Secondary | ICD-10-CM | POA: Diagnosis not present

## 2020-01-23 DIAGNOSIS — N400 Enlarged prostate without lower urinary tract symptoms: Secondary | ICD-10-CM | POA: Diagnosis not present

## 2020-02-09 DIAGNOSIS — N132 Hydronephrosis with renal and ureteral calculous obstruction: Secondary | ICD-10-CM | POA: Diagnosis not present

## 2020-02-09 DIAGNOSIS — N400 Enlarged prostate without lower urinary tract symptoms: Secondary | ICD-10-CM | POA: Diagnosis not present

## 2020-02-23 DIAGNOSIS — N132 Hydronephrosis with renal and ureteral calculous obstruction: Secondary | ICD-10-CM | POA: Diagnosis not present

## 2020-02-23 DIAGNOSIS — N400 Enlarged prostate without lower urinary tract symptoms: Secondary | ICD-10-CM | POA: Diagnosis not present

## 2020-02-23 DIAGNOSIS — N2889 Other specified disorders of kidney and ureter: Secondary | ICD-10-CM | POA: Diagnosis not present

## 2020-03-19 DIAGNOSIS — I429 Cardiomyopathy, unspecified: Secondary | ICD-10-CM | POA: Diagnosis not present

## 2020-05-31 DIAGNOSIS — Z95810 Presence of automatic (implantable) cardiac defibrillator: Secondary | ICD-10-CM | POA: Diagnosis not present

## 2020-05-31 DIAGNOSIS — I2510 Atherosclerotic heart disease of native coronary artery without angina pectoris: Secondary | ICD-10-CM | POA: Diagnosis not present

## 2020-05-31 DIAGNOSIS — I10 Essential (primary) hypertension: Secondary | ICD-10-CM | POA: Diagnosis not present

## 2020-06-25 DIAGNOSIS — I429 Cardiomyopathy, unspecified: Secondary | ICD-10-CM | POA: Diagnosis not present

## 2020-09-03 DIAGNOSIS — I429 Cardiomyopathy, unspecified: Secondary | ICD-10-CM | POA: Diagnosis not present

## 2020-10-17 DIAGNOSIS — E785 Hyperlipidemia, unspecified: Secondary | ICD-10-CM | POA: Diagnosis not present

## 2020-10-17 DIAGNOSIS — I2510 Atherosclerotic heart disease of native coronary artery without angina pectoris: Secondary | ICD-10-CM | POA: Diagnosis not present

## 2020-10-17 DIAGNOSIS — Z95810 Presence of automatic (implantable) cardiac defibrillator: Secondary | ICD-10-CM | POA: Diagnosis not present

## 2020-10-17 DIAGNOSIS — I10 Essential (primary) hypertension: Secondary | ICD-10-CM | POA: Diagnosis not present

## 2020-11-19 DIAGNOSIS — I2510 Atherosclerotic heart disease of native coronary artery without angina pectoris: Secondary | ICD-10-CM | POA: Diagnosis not present

## 2020-11-19 DIAGNOSIS — I10 Essential (primary) hypertension: Secondary | ICD-10-CM | POA: Diagnosis not present

## 2020-11-19 DIAGNOSIS — E785 Hyperlipidemia, unspecified: Secondary | ICD-10-CM | POA: Diagnosis not present

## 2020-11-19 DIAGNOSIS — Z125 Encounter for screening for malignant neoplasm of prostate: Secondary | ICD-10-CM | POA: Diagnosis not present

## 2020-11-23 DIAGNOSIS — R7301 Impaired fasting glucose: Secondary | ICD-10-CM | POA: Diagnosis not present

## 2020-11-24 DIAGNOSIS — Z23 Encounter for immunization: Secondary | ICD-10-CM | POA: Diagnosis not present

## 2020-12-10 DIAGNOSIS — I429 Cardiomyopathy, unspecified: Secondary | ICD-10-CM | POA: Diagnosis not present

## 2021-03-14 DIAGNOSIS — M7752 Other enthesopathy of left foot: Secondary | ICD-10-CM | POA: Diagnosis not present

## 2021-03-14 DIAGNOSIS — M722 Plantar fascial fibromatosis: Secondary | ICD-10-CM | POA: Diagnosis not present

## 2021-03-14 DIAGNOSIS — M79672 Pain in left foot: Secondary | ICD-10-CM | POA: Diagnosis not present

## 2021-03-21 DIAGNOSIS — I429 Cardiomyopathy, unspecified: Secondary | ICD-10-CM | POA: Diagnosis not present

## 2021-04-04 DIAGNOSIS — M722 Plantar fascial fibromatosis: Secondary | ICD-10-CM | POA: Diagnosis not present

## 2021-04-04 DIAGNOSIS — M7752 Other enthesopathy of left foot: Secondary | ICD-10-CM | POA: Diagnosis not present

## 2021-04-04 DIAGNOSIS — M79672 Pain in left foot: Secondary | ICD-10-CM | POA: Diagnosis not present

## 2021-05-31 DIAGNOSIS — I2510 Atherosclerotic heart disease of native coronary artery without angina pectoris: Secondary | ICD-10-CM | POA: Diagnosis not present

## 2021-05-31 DIAGNOSIS — I10 Essential (primary) hypertension: Secondary | ICD-10-CM | POA: Diagnosis not present

## 2021-05-31 DIAGNOSIS — Z95810 Presence of automatic (implantable) cardiac defibrillator: Secondary | ICD-10-CM | POA: Diagnosis not present

## 2021-05-31 DIAGNOSIS — I255 Ischemic cardiomyopathy: Secondary | ICD-10-CM | POA: Diagnosis not present

## 2021-06-04 DIAGNOSIS — L0501 Pilonidal cyst with abscess: Secondary | ICD-10-CM | POA: Diagnosis not present

## 2021-06-04 DIAGNOSIS — R7303 Prediabetes: Secondary | ICD-10-CM | POA: Diagnosis not present

## 2021-06-04 DIAGNOSIS — R7301 Impaired fasting glucose: Secondary | ICD-10-CM | POA: Diagnosis not present

## 2021-06-24 DIAGNOSIS — I429 Cardiomyopathy, unspecified: Secondary | ICD-10-CM | POA: Diagnosis not present

## 2021-07-08 DIAGNOSIS — E669 Obesity, unspecified: Secondary | ICD-10-CM | POA: Diagnosis not present

## 2021-07-08 DIAGNOSIS — I255 Ischemic cardiomyopathy: Secondary | ICD-10-CM | POA: Diagnosis not present

## 2021-07-08 DIAGNOSIS — K8020 Calculus of gallbladder without cholecystitis without obstruction: Secondary | ICD-10-CM | POA: Diagnosis not present

## 2021-07-08 DIAGNOSIS — K819 Cholecystitis, unspecified: Secondary | ICD-10-CM | POA: Diagnosis not present

## 2021-07-08 DIAGNOSIS — K810 Acute cholecystitis: Secondary | ICD-10-CM | POA: Diagnosis not present

## 2021-07-08 DIAGNOSIS — K5730 Diverticulosis of large intestine without perforation or abscess without bleeding: Secondary | ICD-10-CM | POA: Diagnosis not present

## 2021-07-08 DIAGNOSIS — N400 Enlarged prostate without lower urinary tract symptoms: Secondary | ICD-10-CM | POA: Diagnosis not present

## 2021-07-08 DIAGNOSIS — A0472 Enterocolitis due to Clostridium difficile, not specified as recurrent: Secondary | ICD-10-CM | POA: Diagnosis not present

## 2021-07-08 DIAGNOSIS — I2510 Atherosclerotic heart disease of native coronary artery without angina pectoris: Secondary | ICD-10-CM | POA: Diagnosis not present

## 2021-07-08 DIAGNOSIS — I110 Hypertensive heart disease with heart failure: Secondary | ICD-10-CM | POA: Diagnosis not present

## 2021-07-08 DIAGNOSIS — I5022 Chronic systolic (congestive) heart failure: Secondary | ICD-10-CM | POA: Diagnosis not present

## 2021-07-08 DIAGNOSIS — Z20822 Contact with and (suspected) exposure to covid-19: Secondary | ICD-10-CM | POA: Diagnosis not present

## 2021-07-08 DIAGNOSIS — I420 Dilated cardiomyopathy: Secondary | ICD-10-CM | POA: Diagnosis not present

## 2021-07-08 DIAGNOSIS — I509 Heart failure, unspecified: Secondary | ICD-10-CM | POA: Diagnosis not present

## 2021-07-08 DIAGNOSIS — K828 Other specified diseases of gallbladder: Secondary | ICD-10-CM | POA: Diagnosis not present

## 2021-07-16 DIAGNOSIS — K8081 Other cholelithiasis with obstruction: Secondary | ICD-10-CM | POA: Diagnosis not present

## 2021-07-31 DIAGNOSIS — Z95810 Presence of automatic (implantable) cardiac defibrillator: Secondary | ICD-10-CM | POA: Diagnosis not present

## 2021-07-31 DIAGNOSIS — Z01810 Encounter for preprocedural cardiovascular examination: Secondary | ICD-10-CM | POA: Diagnosis not present

## 2021-07-31 DIAGNOSIS — I10 Essential (primary) hypertension: Secondary | ICD-10-CM | POA: Diagnosis not present

## 2021-07-31 DIAGNOSIS — I2510 Atherosclerotic heart disease of native coronary artery without angina pectoris: Secondary | ICD-10-CM | POA: Diagnosis not present

## 2021-08-13 DIAGNOSIS — Z01818 Encounter for other preprocedural examination: Secondary | ICD-10-CM | POA: Diagnosis not present

## 2021-08-13 DIAGNOSIS — K8020 Calculus of gallbladder without cholecystitis without obstruction: Secondary | ICD-10-CM | POA: Diagnosis not present

## 2021-08-26 DIAGNOSIS — E876 Hypokalemia: Secondary | ICD-10-CM | POA: Diagnosis not present

## 2021-08-26 DIAGNOSIS — K8020 Calculus of gallbladder without cholecystitis without obstruction: Secondary | ICD-10-CM | POA: Diagnosis not present

## 2021-08-26 DIAGNOSIS — I2510 Atherosclerotic heart disease of native coronary artery without angina pectoris: Secondary | ICD-10-CM | POA: Diagnosis not present

## 2021-08-26 DIAGNOSIS — I447 Left bundle-branch block, unspecified: Secondary | ICD-10-CM | POA: Diagnosis not present

## 2021-08-26 DIAGNOSIS — I5022 Chronic systolic (congestive) heart failure: Secondary | ICD-10-CM | POA: Diagnosis not present

## 2021-08-26 DIAGNOSIS — I420 Dilated cardiomyopathy: Secondary | ICD-10-CM | POA: Diagnosis not present

## 2021-08-26 DIAGNOSIS — K8000 Calculus of gallbladder with acute cholecystitis without obstruction: Secondary | ICD-10-CM | POA: Diagnosis not present

## 2021-08-26 DIAGNOSIS — I110 Hypertensive heart disease with heart failure: Secondary | ICD-10-CM | POA: Diagnosis not present

## 2021-08-26 DIAGNOSIS — A0472 Enterocolitis due to Clostridium difficile, not specified as recurrent: Secondary | ICD-10-CM | POA: Diagnosis not present

## 2021-08-26 DIAGNOSIS — G8918 Other acute postprocedural pain: Secondary | ICD-10-CM | POA: Diagnosis not present

## 2021-08-26 DIAGNOSIS — R1011 Right upper quadrant pain: Secondary | ICD-10-CM | POA: Diagnosis not present

## 2021-08-26 DIAGNOSIS — I255 Ischemic cardiomyopathy: Secondary | ICD-10-CM | POA: Diagnosis not present

## 2021-08-29 DIAGNOSIS — K5730 Diverticulosis of large intestine without perforation or abscess without bleeding: Secondary | ICD-10-CM | POA: Diagnosis not present

## 2021-08-29 DIAGNOSIS — R1011 Right upper quadrant pain: Secondary | ICD-10-CM | POA: Diagnosis not present

## 2021-08-29 DIAGNOSIS — R109 Unspecified abdominal pain: Secondary | ICD-10-CM | POA: Diagnosis not present

## 2021-08-29 DIAGNOSIS — I110 Hypertensive heart disease with heart failure: Secondary | ICD-10-CM | POA: Diagnosis not present

## 2021-08-29 DIAGNOSIS — Z950 Presence of cardiac pacemaker: Secondary | ICD-10-CM | POA: Diagnosis not present

## 2021-08-29 DIAGNOSIS — I420 Dilated cardiomyopathy: Secondary | ICD-10-CM | POA: Diagnosis not present

## 2021-08-29 DIAGNOSIS — R1111 Vomiting without nausea: Secondary | ICD-10-CM | POA: Diagnosis not present

## 2021-08-29 DIAGNOSIS — K4020 Bilateral inguinal hernia, without obstruction or gangrene, not specified as recurrent: Secondary | ICD-10-CM | POA: Diagnosis not present

## 2021-08-29 DIAGNOSIS — I2510 Atherosclerotic heart disease of native coronary artery without angina pectoris: Secondary | ICD-10-CM | POA: Diagnosis not present

## 2021-08-29 DIAGNOSIS — I5022 Chronic systolic (congestive) heart failure: Secondary | ICD-10-CM | POA: Diagnosis not present

## 2021-08-29 DIAGNOSIS — Z743 Need for continuous supervision: Secondary | ICD-10-CM | POA: Diagnosis not present

## 2021-08-29 DIAGNOSIS — R509 Fever, unspecified: Secondary | ICD-10-CM | POA: Diagnosis not present

## 2021-08-29 DIAGNOSIS — I255 Ischemic cardiomyopathy: Secondary | ICD-10-CM | POA: Diagnosis not present

## 2021-08-29 DIAGNOSIS — E876 Hypokalemia: Secondary | ICD-10-CM | POA: Diagnosis not present

## 2021-08-29 DIAGNOSIS — I447 Left bundle-branch block, unspecified: Secondary | ICD-10-CM | POA: Diagnosis not present

## 2021-08-29 DIAGNOSIS — A0472 Enterocolitis due to Clostridium difficile, not specified as recurrent: Secondary | ICD-10-CM | POA: Diagnosis not present

## 2021-08-29 DIAGNOSIS — R112 Nausea with vomiting, unspecified: Secondary | ICD-10-CM | POA: Diagnosis not present

## 2021-08-30 DIAGNOSIS — I110 Hypertensive heart disease with heart failure: Secondary | ICD-10-CM | POA: Diagnosis not present

## 2021-08-30 DIAGNOSIS — E876 Hypokalemia: Secondary | ICD-10-CM | POA: Diagnosis not present

## 2021-08-30 DIAGNOSIS — I255 Ischemic cardiomyopathy: Secondary | ICD-10-CM | POA: Diagnosis not present

## 2021-08-30 DIAGNOSIS — I5022 Chronic systolic (congestive) heart failure: Secondary | ICD-10-CM | POA: Diagnosis not present

## 2021-08-30 DIAGNOSIS — G8918 Other acute postprocedural pain: Secondary | ICD-10-CM | POA: Diagnosis not present

## 2021-08-30 DIAGNOSIS — I447 Left bundle-branch block, unspecified: Secondary | ICD-10-CM | POA: Diagnosis not present

## 2021-08-30 DIAGNOSIS — A0472 Enterocolitis due to Clostridium difficile, not specified as recurrent: Secondary | ICD-10-CM | POA: Diagnosis not present

## 2021-08-30 DIAGNOSIS — D72829 Elevated white blood cell count, unspecified: Secondary | ICD-10-CM | POA: Diagnosis not present

## 2021-08-30 DIAGNOSIS — R109 Unspecified abdominal pain: Secondary | ICD-10-CM | POA: Diagnosis not present

## 2021-08-30 DIAGNOSIS — I420 Dilated cardiomyopathy: Secondary | ICD-10-CM | POA: Diagnosis not present

## 2021-08-30 DIAGNOSIS — I2510 Atherosclerotic heart disease of native coronary artery without angina pectoris: Secondary | ICD-10-CM | POA: Diagnosis not present

## 2021-08-30 DIAGNOSIS — S301XXA Contusion of abdominal wall, initial encounter: Secondary | ICD-10-CM | POA: Diagnosis not present

## 2021-08-31 DIAGNOSIS — I447 Left bundle-branch block, unspecified: Secondary | ICD-10-CM | POA: Diagnosis not present

## 2021-08-31 DIAGNOSIS — I2510 Atherosclerotic heart disease of native coronary artery without angina pectoris: Secondary | ICD-10-CM | POA: Diagnosis not present

## 2021-08-31 DIAGNOSIS — D72829 Elevated white blood cell count, unspecified: Secondary | ICD-10-CM | POA: Diagnosis not present

## 2021-08-31 DIAGNOSIS — S301XXA Contusion of abdominal wall, initial encounter: Secondary | ICD-10-CM | POA: Diagnosis not present

## 2021-08-31 DIAGNOSIS — E876 Hypokalemia: Secondary | ICD-10-CM | POA: Diagnosis not present

## 2021-08-31 DIAGNOSIS — I110 Hypertensive heart disease with heart failure: Secondary | ICD-10-CM | POA: Diagnosis not present

## 2021-08-31 DIAGNOSIS — G8918 Other acute postprocedural pain: Secondary | ICD-10-CM | POA: Diagnosis not present

## 2021-08-31 DIAGNOSIS — A0472 Enterocolitis due to Clostridium difficile, not specified as recurrent: Secondary | ICD-10-CM | POA: Diagnosis not present

## 2021-08-31 DIAGNOSIS — I255 Ischemic cardiomyopathy: Secondary | ICD-10-CM | POA: Diagnosis not present

## 2021-08-31 DIAGNOSIS — R109 Unspecified abdominal pain: Secondary | ICD-10-CM | POA: Diagnosis not present

## 2021-08-31 DIAGNOSIS — I420 Dilated cardiomyopathy: Secondary | ICD-10-CM | POA: Diagnosis not present

## 2021-08-31 DIAGNOSIS — I5022 Chronic systolic (congestive) heart failure: Secondary | ICD-10-CM | POA: Diagnosis not present

## 2021-09-01 DIAGNOSIS — I447 Left bundle-branch block, unspecified: Secondary | ICD-10-CM | POA: Diagnosis not present

## 2021-09-01 DIAGNOSIS — I110 Hypertensive heart disease with heart failure: Secondary | ICD-10-CM | POA: Diagnosis not present

## 2021-09-01 DIAGNOSIS — G8918 Other acute postprocedural pain: Secondary | ICD-10-CM | POA: Diagnosis not present

## 2021-09-01 DIAGNOSIS — I420 Dilated cardiomyopathy: Secondary | ICD-10-CM | POA: Diagnosis not present

## 2021-09-01 DIAGNOSIS — I2510 Atherosclerotic heart disease of native coronary artery without angina pectoris: Secondary | ICD-10-CM | POA: Diagnosis not present

## 2021-09-01 DIAGNOSIS — I255 Ischemic cardiomyopathy: Secondary | ICD-10-CM | POA: Diagnosis not present

## 2021-09-01 DIAGNOSIS — A0472 Enterocolitis due to Clostridium difficile, not specified as recurrent: Secondary | ICD-10-CM | POA: Diagnosis not present

## 2021-09-01 DIAGNOSIS — D72829 Elevated white blood cell count, unspecified: Secondary | ICD-10-CM | POA: Diagnosis not present

## 2021-09-01 DIAGNOSIS — R109 Unspecified abdominal pain: Secondary | ICD-10-CM | POA: Diagnosis not present

## 2021-09-01 DIAGNOSIS — S301XXA Contusion of abdominal wall, initial encounter: Secondary | ICD-10-CM | POA: Diagnosis not present

## 2021-09-01 DIAGNOSIS — I5022 Chronic systolic (congestive) heart failure: Secondary | ICD-10-CM | POA: Diagnosis not present

## 2021-09-01 DIAGNOSIS — E876 Hypokalemia: Secondary | ICD-10-CM | POA: Diagnosis not present

## 2021-09-17 DIAGNOSIS — I429 Cardiomyopathy, unspecified: Secondary | ICD-10-CM | POA: Diagnosis not present

## 2021-09-18 DIAGNOSIS — Z09 Encounter for follow-up examination after completed treatment for conditions other than malignant neoplasm: Secondary | ICD-10-CM | POA: Diagnosis not present

## 2021-11-23 DIAGNOSIS — Z23 Encounter for immunization: Secondary | ICD-10-CM | POA: Diagnosis not present

## 2021-12-04 DIAGNOSIS — E785 Hyperlipidemia, unspecified: Secondary | ICD-10-CM | POA: Diagnosis not present

## 2021-12-04 DIAGNOSIS — R7301 Impaired fasting glucose: Secondary | ICD-10-CM | POA: Diagnosis not present

## 2021-12-04 DIAGNOSIS — I10 Essential (primary) hypertension: Secondary | ICD-10-CM | POA: Diagnosis not present

## 2021-12-04 DIAGNOSIS — I2510 Atherosclerotic heart disease of native coronary artery without angina pectoris: Secondary | ICD-10-CM | POA: Diagnosis not present

## 2021-12-04 DIAGNOSIS — Z125 Encounter for screening for malignant neoplasm of prostate: Secondary | ICD-10-CM | POA: Diagnosis not present

## 2021-12-23 DIAGNOSIS — I429 Cardiomyopathy, unspecified: Secondary | ICD-10-CM | POA: Diagnosis not present

## 2022-06-12 ENCOUNTER — Telehealth: Payer: Self-pay | Admitting: Cardiovascular Disease

## 2022-06-12 NOTE — Telephone Encounter (Signed)
Error
